# Patient Record
Sex: Male | Born: 1970 | Hispanic: No | Smoking: Never smoker
Health system: Southern US, Community
[De-identification: ages and names within clinical notes are randomized; demographics above are authoritative.]

## PROBLEM LIST (undated history)

## (undated) ENCOUNTER — Emergency Department (HOSPITAL_COMMUNITY): Admission: EM | Payer: PRIVATE HEALTH INSURANCE | Source: Home / Self Care

## (undated) DIAGNOSIS — N492 Inflammatory disorders of scrotum: Secondary | ICD-10-CM

## (undated) DIAGNOSIS — E7800 Pure hypercholesterolemia, unspecified: Secondary | ICD-10-CM

## (undated) DIAGNOSIS — A4902 Methicillin resistant Staphylococcus aureus infection, unspecified site: Secondary | ICD-10-CM

## (undated) HISTORY — DX: Inflammatory disorders of scrotum: N49.2

## (undated) HISTORY — PX: TONSILLECTOMY: SUR1361

## (undated) HISTORY — DX: Pure hypercholesterolemia, unspecified: E78.00

## (undated) HISTORY — DX: Methicillin resistant Staphylococcus aureus infection, unspecified site: A49.02

---

## 2003-04-27 ENCOUNTER — Other Ambulatory Visit: Admission: RE | Admit: 2003-04-27 | Discharge: 2003-04-27 | Payer: Self-pay | Admitting: Unknown Physician Specialty

## 2003-05-11 ENCOUNTER — Other Ambulatory Visit: Admission: RE | Admit: 2003-05-11 | Discharge: 2003-05-11 | Payer: Self-pay | Admitting: Unknown Physician Specialty

## 2013-01-06 ENCOUNTER — Encounter (HOSPITAL_COMMUNITY): Payer: Self-pay | Admitting: Emergency Medicine

## 2013-01-06 ENCOUNTER — Encounter (HOSPITAL_COMMUNITY): Payer: Self-pay

## 2013-01-06 ENCOUNTER — Emergency Department (HOSPITAL_COMMUNITY)
Admission: EM | Admit: 2013-01-06 | Discharge: 2013-01-06 | Disposition: A | Discharge status: Home / Self Care | Payer: PRIVATE HEALTH INSURANCE | Attending: Emergency Medicine | Admitting: Emergency Medicine

## 2013-01-06 DIAGNOSIS — N5089 Other specified disorders of the male genital organs: Secondary | ICD-10-CM

## 2013-01-06 DIAGNOSIS — N508 Other specified disorders of male genital organs: Secondary | ICD-10-CM | POA: Insufficient documentation

## 2013-01-06 DIAGNOSIS — N509 Disorder of male genital organs, unspecified: Secondary | ICD-10-CM | POA: Insufficient documentation

## 2013-01-06 DIAGNOSIS — R110 Nausea: Secondary | ICD-10-CM | POA: Insufficient documentation

## 2013-01-06 DIAGNOSIS — Z79899 Other long term (current) drug therapy: Secondary | ICD-10-CM | POA: Insufficient documentation

## 2013-01-06 DIAGNOSIS — N498 Inflammatory disorders of other specified male genital organs: Secondary | ICD-10-CM | POA: Insufficient documentation

## 2013-01-06 DIAGNOSIS — N492 Inflammatory disorders of scrotum: Secondary | ICD-10-CM

## 2013-01-06 MED ORDER — TRAMADOL HCL 50 MG PO TABS
50.0000 mg | ORAL_TABLET | Freq: Once | ORAL | Status: AC
  Administered 2013-01-06: 50 mg via ORAL
  Filled 2013-01-06: qty 1

## 2013-01-06 MED ORDER — TRAMADOL HCL 50 MG PO TABS: 50.0000 mg | ORAL_TABLET | Freq: Four times a day (QID) | ORAL | Status: DC | PRN

## 2013-01-06 MED ORDER — HYDROMORPHONE HCL PF 1 MG/ML IJ SOLN
1.0000 mg | Freq: Once | INTRAMUSCULAR | Status: AC
  Administered 2013-01-06: 1 mg via INTRAMUSCULAR
  Filled 2013-01-06: qty 1

## 2013-01-06 MED ORDER — OXYCODONE-ACETAMINOPHEN 5-325 MG PO TABS: 1.0000 | ORAL_TABLET | ORAL | Status: DC | PRN

## 2013-01-06 MED ORDER — IBUPROFEN 800 MG PO TABS: 800.0000 mg | ORAL_TABLET | Freq: Three times a day (TID) | ORAL | Status: DC

## 2013-01-06 MED ORDER — LIDOCAINE HCL (PF) 1 % IJ SOLN
5.0000 mL | Freq: Once | INTRAMUSCULAR | Status: AC
  Administered 2013-01-06: 5 mL via INTRADERMAL
  Filled 2013-01-06: qty 5

## 2013-01-06 NOTE — ED Notes (Signed)
Patient reports abscess/hard cyst to scrotum. Reports recently seen and treated with PO antibiotics, but pain and swelling have increased tremendously.

## 2013-01-06 NOTE — ED Notes (Signed)
Pt alert & oriented x4, stable gait. Patient given discharge instructions, paperwork & prescription(s). Patient  instructed to stop at the registration desk to finish any additional paperwork. Patient verbalized understanding. Pt left department w/ no further questions. 

## 2013-01-06 NOTE — ED Provider Notes (Signed)
History     CSN: [id number]  Arrival date & time 01/06/13  0004   First MD Initiated Contact with Patient 01/06/13 0006      Chief Complaint  Patient presents with  . Recurrent Skin Infections    (Consider location/radiation/quality/duration/timing/severity/associated sxs/prior treatment) HPI Jim Hamilton is a 42 y.o. male who presents to the ED with pain and swelling to the scrotum. The pain started 3 days ago. He has been taking Septra DS and sitting in tubs of warm water. The warm water helps for a while but then the pain returns. He has a history of recurrent dermoid cysts but has never had one in this area before. He sees Dr. Nita Sells (Derm.) and that is why he has the Rx for the Septra for when he has an are that comes up. The history was provided by the patient.  History reviewed. No pertinent past medical history.  History reviewed. No pertinent past surgical history.  No family history on file.  History  Substance Use Topics  . Smoking status: Not on file  . Smokeless tobacco: Not on file  . Alcohol Use: No      Review of Systems  Constitutional: Negative for fever and activity change.  Cardiovascular: Negative for leg swelling.  Gastrointestinal: Positive for nausea. Negative for vomiting and abdominal pain.  Genitourinary: Positive for scrotal swelling. Negative for dysuria, urgency, frequency and difficulty urinating.  Skin: Negative for wound.       ? abscess  Neurological: Negative for light-headedness.  Psychiatric/Behavioral: The patient is not nervous/anxious.     Allergies  Tylenol  Home Medications   Current Outpatient Rx  Name  Route  Sig  Dispense  Refill  . sulfamethoxazole-trimethoprim (BACTRIM DS) 800-160 MG per tablet   Oral   Take 1 tablet by mouth 2 (two) times daily.         Marland Kitchen ibuprofen (ADVIL,MOTRIN) 800 MG tablet   Oral   Take 1 tablet (800 mg total) by mouth 3 (three) times daily.   21 tablet   0   . traMADol (ULTRAM) 50 MG  tablet   Oral   Take 1 tablet (50 mg total) by mouth every 6 (six) hours as needed for pain.   15 tablet   0     BP 128/68  Pulse 81  Temp(Src) 97.9 F (36.6 C) (Oral)  Resp 16  Ht 5\' 7"  (1.702 m)  Wt 149 lb (67.586 kg)  BMI 23.33 kg/m2  SpO2 100%  Physical Exam  Nursing note and vitals reviewed. Constitutional: He is oriented to person, place, and time. He appears well-developed and well-nourished. No distress.  HENT:  Head: Normocephalic.  Eyes: EOM are normal.  Neck: Neck supple.  Cardiovascular: Normal rate.   Pulmonary/Chest: Effort normal.  Abdominal: Soft. There is no tenderness.  Genitourinary:     There is tenderness and a firm mass palpated.   Musculoskeletal: Normal range of motion.  Neurological: He is alert and oriented to person, place, and time. No cranial nerve deficit.  Psychiatric: He has a normal mood and affect. His behavior is normal. Judgment and thought content normal.    ED Course: Dr. Eber Hong in with ultrasound to evaluate the mass. On ultrasound there is no fluid noted.  Procedures (including cr  1. Mass, scrotum    Plan: Discussed findings with the patient and need for follow up with Urology.   Will treat pain and patient will continue Septra DS and warm tub baths.  He will call for follow up appointment tomorrow.  MDM  Discussed with the patient clinical findings and plan of care. All questioned fully answered. Patient is stable for discharge home without any immediate complications.   Medication List    TAKE these medications       ibuprofen 800 MG tablet  Commonly known as:  ADVIL,MOTRIN  Take 1 tablet (800 mg total) by mouth 3 (three) times daily.     traMADol 50 MG tablet  Commonly known as:  ULTRAM  Take 1 tablet (50 mg total) by mouth every 6 (six) hours as needed for pain.      ASK your doctor about these medications       sulfamethoxazole-trimethoprim 800-160 MG per tablet  Commonly known as:  BACTRIM DS  Take 1  tablet by mouth 2 (two) times daily.             [address] [place], [place] 01/06/13 [phone]

## 2013-01-06 NOTE — ED Provider Notes (Signed)
Pt with scrotal pain X several days - has discrete tender, non erythematous mass on the midline just at the base of the scrotum.  No sub q emphysema, no pain with exam of the testicles or penis and no abd pain.  No /f/cn/v.  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  Pt informed, pain control, abx (as he states this is how they have been treated in the past) and f/u with dermatology / Urology  Medical screening examination/treatment/procedure(s) were conducted as a shared visit with non-physician practitioner(s) and myself.  I personally evaluated the patient during the encounter    Vida Roller, MD 01/06/13 [phone]

## 2013-01-06 NOTE — ED Provider Notes (Signed)
pt seen with NP Damian Leavell Here with worsening pain, redness and tenderness to scrotum.  Bedside ultrasound reveals small pocket of fluid.  I suspect this has now turned into abscess with overlying cellulitis.  I discussed options, and he is comfortable with bedside drainage here in the ED.  He was unable to f/u with urology today due to local urologist out of town.  There was no crepitance to scrotum, no erythema/tenderness into perineum and it clearly does not extend into upper scrotum and the testicles are freely mobile without tenderness.  I suspect this is cutaneous.  Small incision was made just right of midline on lower scrotum and fluid was extracted.  Pt tolerated well, feels improved and no immediate complications.  He will return this Saturday morning for recheck.  Discussed strict return precautions  INCISION AND DRAINAGE Performed by: Joya Gaskins Consent: Verbal consent obtained. Risks and benefits: risks, benefits and alternatives were discussed Type: abscess  Body area: lower scrotum  Anesthesia: local infiltration  Incision was made with a scalpel.  Local anesthetic: lidocaine 1%   Anesthetic total: 3 ml  Complexity: complex Blunt dissection to break up loculations  Drainage: purulent  Drainage amount: minimal  Packing material: 1/4 in iodoform gauze  Patient tolerance: Patient tolerated the procedure well with no immediate complications.     Joya Gaskins, MD 01/06/13 2325

## 2013-01-06 NOTE — ED Notes (Signed)
Pt has history of recurrent skin abcesses, states tonight with c/o swelling, pain and redness to scrotum.

## 2013-01-06 NOTE — ED Provider Notes (Signed)
Medical screening examination/treatment/procedure(s) were conducted as a shared visit with non-physician practitioner(s) and myself.  I personally evaluated the patient during the encounter   Joya Gaskins, MD 01/06/13 2345

## 2013-01-06 NOTE — Discharge Instructions (Signed)
Scrotal Swelling Scrotal swelling may occur on one or both sides of the scrotum. Pain may also occur with swelling. Early evaluation with your caregiver and treatment is important.  CAUSES   Injury.  Infection.  An ingrown hair or abrasion in the area.  Repeated rubbing from tight-fitting underwear.  Poor hygiene.  A weakened area in the muscles around the groin (hernia). A hernia can allow abdominal contents to push into the scrotum.  Fluid around the testicle (hydrocele).  Enlarged vein around the testicle (varicocele).  Certain medical treatments or existing conditions.  A recent genital surgery or procedure.  The spermatic cord becomes twisted in the scrotum, which cuts off blood supply (testicular torsion).  Testicular cancer. DIAGNOSIS A physical exam and medical history may be performed. You will be asked questions about recent activity and where and when the swelling began. Further tests may be performed to confirm the diagnosis, such as an imaging test (ultrasound or MRI). TREATMENT Treatment will depend on the cause of the swelling. Treatment may include:  Self care (rest, ice, limiting activity, scrotal support).  Pain medicine.  Taking medicines to treat an infection.  Surgery or help from a specialist (urologist). HOME CARE INSTRUCTIONS  Rest and limit activity until the swelling goes away. Lying down is the preferred position.  Put ice on the scrotum.  Put ice in a plastic bag.  Place a towel between your skin and the bag.  Leave the ice on for 15 to 20 minutes, 3 to 4 times a day for 1 to 2 days.  Place a rolled towel under the testicles for support.  Wear loose-fitting clothing or an athletic support cup for comfort.  Take all medicines as directed by your caregiver.  Perform a monthly self-exam of the scrotum and penis. Feel for changes. Ask your caregiver how to perform a monthly self-exam if you are unsure. There are a few things you can  do to help prevent injury or infection that can lead to swelling:  Wear an athletic support and protective cup during sports activities.  Practice safe sex. Wear condoms.  Follow all of your caregiver's recommendations after a surgical procedure. Discuss appropriate time frames for healing and bedrest with your surgeon.  Get vaccinated against mumps, measles, and rubella (MMR).  Use good body mechanics during activity and when lifting heavy objects. Avoid bearing down or straining.  Drink enough fluids to keep your urine clear or pale yellow. Always empty the bladder completely when urinating to avoid infection. Finding out the results of your test Ask when your test results will be ready. Make sure you get your test results. SEEK MEDICAL CARE IF:  You have a sudden (acute) onset of pain that is persistent and not improving.  You notice a heavy feeling or fluid in the scrotum.  You have pain or burning while urinating.  You have blood in the urine or semen.  You feel a lump around the testicle.  You notice that one testicle is larger than the other (slight variation is normal).  You have a persistent dull ache or pain in the groin or scrotum.  You need instruction on performing a monthly self-exam. SEEK IMMEDIATE MEDICAL CARE IF:  The pain does not go away or becomes severe.  You have a fever.  You have pain or vomiting that cannot be controlled.  You notice significant redness or swelling of one or both sides of the scrotum. MAKE SURE YOU:  Understand these instructions.  Will watch  your condition.  Will get help right away if you are not doing well or get worse. Document Released: 09/13/2010 Document Revised: 11/03/2011 Document Reviewed: 09/13/2010 [facility] Patient Information 2013 [place], [place].

## 2013-01-06 NOTE — ED Provider Notes (Signed)
History     CSN: [id number]  Arrival date & time 01/06/13  2112   First MD Initiated Contact with Patient 01/06/13 2141      Chief Complaint  Patient presents with  . Abscess    (Consider location/radiation/quality/duration/timing/severity/associated sxs/prior treatment) HPI Jim Hamilton is a 42 y.o. male who presents to the ED with pain in the scrotal area. He was here last night and evaluated for same. He called Dr. Jerre Simon  the Urologist today for follow up and was told he would be out of the office for several days and was given the number for [facility] in [place]. He called and they could not see him before June 6th. The pain tonight is severe and the swelling has increased and now there is redness. Patient is taking his antibiotics. The history was provided by the patient and his medical record.   History reviewed. No pertinent past medical history.  History reviewed. No pertinent past surgical history.  History reviewed. No pertinent family history.  History  Substance Use Topics  . Smoking status: Never Smoker   . Smokeless tobacco: Not on file  . Alcohol Use: No      Review of Systems  Constitutional: Negative for fever and chills.  Genitourinary: Positive for scrotal swelling and testicular pain.  Musculoskeletal: Negative for back pain.  Neurological: Negative for headaches.  Psychiatric/Behavioral: The patient is not nervous/anxious.     Allergies  Tylenol  Home Medications   Current Outpatient Rx  Name  Route  Sig  Dispense  Refill  . ibuprofen (ADVIL,MOTRIN) 800 MG tablet   Oral   Take 1 tablet (800 mg total) by mouth 3 (three) times daily.   21 tablet   0   . omeprazole (PRILOSEC) 20 MG capsule   Oral   Take 20 mg by mouth daily.         Marland Kitchen sulfamethoxazole-trimethoprim (BACTRIM DS) 800-160 MG per tablet   Oral   Take 1 tablet by mouth 2 (two) times daily. Started on 01/05/2013         . traMADol (ULTRAM) 50 MG tablet   Oral  Take 1 tablet (50 mg total) by mouth every 6 (six) hours as needed for pain.   15 tablet   0     BP 124/68  Pulse 93  Temp(Src) 98.6 F (37 C) (Oral)  Resp 18  Ht 5\' 7"  (1.702 m)  Wt 150 lb (68.04 kg)  BMI 23.49 kg/m2  SpO2 100%  Physical Exam  Nursing note and vitals reviewed. Constitutional: He appears well-developed and well-nourished. No distress.  HENT:  Head: Normocephalic.  Eyes: EOM are normal.  Neck: Neck supple.  Cardiovascular: Normal rate.   Pulmonary/Chest: Effort normal.  Genitourinary:     Tenderness and swelling has increased since visit last night. There is also erythema noted tonight.  Musculoskeletal: Normal range of motion.  Psychiatric: He has a normal mood and affect.    ED Course  Procedures (including critical care time) Dr. Bebe Shaggy in to examine the patient. He will assume care and I&D the abscess.  MDM          Janne Napoleon, NP 01/06/13 2306

## 2013-01-31 ENCOUNTER — Ambulatory Visit (INDEPENDENT_AMBULATORY_CARE_PROVIDER_SITE_OTHER): Payer: PRIVATE HEALTH INSURANCE | Admitting: Infectious Disease

## 2013-01-31 ENCOUNTER — Other Ambulatory Visit: Payer: Self-pay | Admitting: Licensed Clinical Social Worker

## 2013-01-31 ENCOUNTER — Encounter: Payer: Self-pay | Admitting: Infectious Disease

## 2013-01-31 VITALS — BP 121/78 | HR 80 | Temp 97.9°F | Ht 67.0 in | Wt 156.0 lb

## 2013-01-31 DIAGNOSIS — Z113 Encounter for screening for infections with a predominantly sexual mode of transmission: Secondary | ICD-10-CM

## 2013-01-31 DIAGNOSIS — A4902 Methicillin resistant Staphylococcus aureus infection, unspecified site: Secondary | ICD-10-CM

## 2013-01-31 DIAGNOSIS — N492 Inflammatory disorders of scrotum: Secondary | ICD-10-CM | POA: Insufficient documentation

## 2013-01-31 DIAGNOSIS — M6520 Calcific tendinitis, unspecified site: Secondary | ICD-10-CM

## 2013-01-31 DIAGNOSIS — N498 Inflammatory disorders of other specified male genital organs: Secondary | ICD-10-CM

## 2013-01-31 DIAGNOSIS — M71449 Calcium deposit in bursa, unspecified hand: Secondary | ICD-10-CM

## 2013-01-31 LAB — BASIC METABOLIC PANEL WITH GFR
BUN: 15 mg/dL (ref 6–23)
Calcium: 10 mg/dL (ref 8.4–10.5)
GFR, Est African American: >89 mL/min
Glucose, Bld: 91 mg/dL (ref 70–99)
Potassium: 4.9 mEq/L (ref 3.5–5.3)

## 2013-01-31 MED ORDER — DOXYCYCLINE HYCLATE 100 MG PO TABS: 100.0000 mg | ORAL_TABLET | Freq: Two times a day (BID) | ORAL | Status: DC

## 2013-01-31 NOTE — Progress Notes (Signed)
Subjective:    Patient ID: Jim Hamilton, male    DOB: [birth year], 42 y.o.   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 He states for >10 years or more he has had recurrences bumps on his head, leg, arms that rise up and then harden and then resolve. He claims that he has been treated by his PCP and by a Dermatologist who stated that he has abnormal deposition of calcium in his skin and soft tissue as part of a heriditary condition ? calcium pyrophosphate crystal deposition disease?  In any case he was recently seen in ED for scrotal abscess that required I and D and from which grew MRSA R to FQ, S to TMP/SMX, Tetracyclines. He has finished therapy for this and was seen by [facility]. He currently has a large lesion on his scalp that he says has been there for 4 months and a newer lesion on right thigh. He was referred to [place] for evaluation and treatment. His 58 year old daughter is apparently suffering from eczema and may also be colonized with MRSA. Wife has had no symptomatic disease.   Review of Systems  Constitutional: Negative for fever, chills, diaphoresis, activity change, appetite change, fatigue and unexpected weight change.  HENT: Negative for congestion, sore throat, rhinorrhea, sneezing, trouble swallowing and sinus pressure.   Eyes: Negative for photophobia and visual disturbance.  Respiratory: Negative for cough, chest tightness, shortness of breath, wheezing and stridor.   Cardiovascular: Negative for chest pain, palpitations and leg swelling.  Gastrointestinal: Negative for nausea, vomiting, abdominal pain, diarrhea, constipation, blood in stool, abdominal distention and anal bleeding.  Genitourinary: Negative for dysuria, hematuria, flank pain and difficulty urinating.  Musculoskeletal: Negative for myalgias, back pain, joint swelling, arthralgias and gait problem.  Skin:  Positive for color change. Negative for pallor, rash and wound.  Neurological: Negative for dizziness, tremors, weakness and light-headedness.  Hematological: Negative for adenopathy. Does not bruise/bleed easily.  Psychiatric/Behavioral: Negative for behavioral problems, confusion, sleep disturbance, dysphoric mood, decreased concentration and agitation.       Objective:   Physical Exam  Constitutional: He is oriented to person, place, and time. He appears well-developed and well-nourished. No distress.  HENT:  Head: Normocephalic and atraumatic.    Eyes: Conjunctivae and EOM are normal.  Neck: Normal range of motion. Neck supple. No JVD present.  Cardiovascular: Normal rate, regular rhythm and normal heart sounds.   Pulmonary/Chest: Effort normal. No respiratory distress.  Abdominal: He exhibits mass. He exhibits no distension. There is no tenderness. There is no rebound.  Musculoskeletal: He exhibits no edema and no tenderness.       Arms:      Legs: Neurological: He is alert and oriented to person, place, and time. He exhibits normal muscle tone. Coordination normal.  Skin: Skin is warm and dry. He is not diaphoretic. There is erythema. No pallor.  Psychiatric: He has a normal mood and affect. His behavior is normal. Judgment and thought content normal.          Assessment & Plan:  #1 Recurrent MRSA infection: not clear how much of this is true MRSA driven vs condition his dermatologist has diagnosed him with. I will test him for HIV, I will give him 30 day rx with doxycycline and then attempt a decolonization regimen with him , his wife and daughter in synchrony. I have advised him to refrain from using  his shaving razor that he uses on his scalp where he has frequent eruptions   #2 calcium pyrophosphate crystal deposition disease?: would like to get records from his Dermatologist  #3 Scrotal abscess: resolved  #4 STD screen: check HIV

## 2013-02-24 ENCOUNTER — Ambulatory Visit (INDEPENDENT_AMBULATORY_CARE_PROVIDER_SITE_OTHER): Payer: PRIVATE HEALTH INSURANCE | Admitting: Infectious Disease

## 2013-02-24 ENCOUNTER — Encounter: Payer: Self-pay | Admitting: Infectious Disease

## 2013-02-24 VITALS — BP 138/73 | HR 81 | Temp 98.0°F | Ht 67.0 in | Wt 157.0 lb

## 2013-02-24 DIAGNOSIS — A4902 Methicillin resistant Staphylococcus aureus infection, unspecified site: Secondary | ICD-10-CM

## 2013-02-24 DIAGNOSIS — M71441 Calcium deposit in bursa, right hand: Secondary | ICD-10-CM

## 2013-02-24 DIAGNOSIS — Z113 Encounter for screening for infections with a predominantly sexual mode of transmission: Secondary | ICD-10-CM

## 2013-02-24 DIAGNOSIS — M6520 Calcific tendinitis, unspecified site: Secondary | ICD-10-CM

## 2013-02-24 MED ORDER — CHLORHEXIDINE GLUCONATE 4 % EX LIQD: 60.0000 mL | CUTANEOUS | Status: DC

## 2013-02-24 MED ORDER — MUPIROCIN 2 % EX OINT: TOPICAL_OINTMENT | Freq: Two times a day (BID) | CUTANEOUS | Status: DC

## 2013-02-24 NOTE — Progress Notes (Signed)
Subjective:    Patient ID: Jim Hamilton, male    DOB: 1971/01/02, 42 y.o.   MRN: [id number]  HPI   42 year old Caucasian male with  History of >10 years or more he has had recurrences bumps on his head, leg, arms that rise up and then harden and then resolve. He claims that he has been treated by his PCP and by a Dermatologist who stated that he has abnormal deposition of calcium in his skin and soft tissue as part of a heriditary condition.  calcium pyrophosphate crystal deposition disease?  In any case he was recently seen in ED for scrotal abscess that required I and D and from which grew MRSA R to FQ, S to TMP/SMX, Tetracyclines. He has finished therapy for this and was seen by [facility].  I saw him a month ago and gave him a course of doxycyline but there has been little change in a large lesion on his scalp that he says has been there for 4 months     Review of Systems  Constitutional: Negative for fever, chills, diaphoresis, activity change, appetite change, fatigue and unexpected weight change.  HENT: Negative for congestion, sore throat, rhinorrhea, sneezing, trouble swallowing and sinus pressure.   Eyes: Negative for photophobia and visual disturbance.  Respiratory: Negative for cough, chest tightness, shortness of breath, wheezing and stridor.   Cardiovascular: Negative for chest pain, palpitations and leg swelling.  Gastrointestinal: Negative for nausea, vomiting, abdominal pain, diarrhea, constipation, blood in stool, abdominal distention and anal bleeding.  Genitourinary: Negative for dysuria, hematuria, flank pain and difficulty urinating.  Musculoskeletal: Negative for myalgias, back pain, joint swelling, arthralgias and gait problem.  Skin: Positive for rash. Negative for color change, pallor and wound.  Neurological: Negative for dizziness, tremors, weakness and light-headedness.  Hematological: Negative for adenopathy. Does not bruise/bleed easily.   Psychiatric/Behavioral: Negative for behavioral problems, confusion, sleep disturbance, dysphoric mood, decreased concentration and agitation.       Objective:   Physical Exam  Constitutional: He is oriented to person, place, and time. He appears well-developed and well-nourished. No distress.  HENT:  Head: Normocephalic and atraumatic.  Mouth/Throat: Oropharynx is clear and moist. No oropharyngeal exudate.  Eyes: Conjunctivae and EOM are normal. Pupils are equal, round, and reactive to light. No scleral icterus.  Neck: Normal range of motion. Neck supple. No JVD present.  Cardiovascular: Normal rate, regular rhythm and normal heart sounds.  Exam reveals no gallop and no friction rub.   No murmur heard. Pulmonary/Chest: Effort normal and breath sounds normal. No respiratory distress. He has no wheezes. He has no rales. He exhibits no tenderness.  Abdominal: He exhibits no distension and no mass. There is no tenderness. There is no rebound and no guarding.  Musculoskeletal: He exhibits no edema and no tenderness.  Lymphadenopathy:    He has no cervical adenopathy.  Neurological: He is alert and oriented to person, place, and time. He exhibits normal muscle tone. Coordination normal.  Skin: Skin is warm and dry. He is not diaphoretic. No erythema. No pallor.  Psychiatric: He has a normal mood and affect. His behavior is normal. Judgment and thought content normal.          Assessment & Plan:   #1 Recurrent MRSA infection: not clear how much of this is true MRSA driven vs condition his dermatologist has diagnosed him with. I will also give a decolonization regimen with him , his wife and daughter in synchrony. I have advised him  to refrain from using his shaving razor that he uses on his scalp where he has frequent eruptions   #2 calcium pyrophosphate crystal deposition disease?: would like to get records from his Dermatologist Dr Jim Hamilton  #3 Scrotal abscess: resolved  #4 STD  screen: HIV negative.

## 2013-03-10 ENCOUNTER — Ambulatory Visit: Payer: PRIVATE HEALTH INSURANCE | Admitting: Infectious Disease

## 2013-05-30 ENCOUNTER — Ambulatory Visit: Payer: PRIVATE HEALTH INSURANCE | Admitting: Infectious Disease

## 2013-06-30 ENCOUNTER — Other Ambulatory Visit: Payer: Self-pay

## 2013-11-04 ENCOUNTER — Other Ambulatory Visit: Payer: Self-pay | Admitting: Infectious Disease

## 2015-07-27 ENCOUNTER — Encounter: Payer: Self-pay | Admitting: Family Medicine

## 2015-07-27 ENCOUNTER — Ambulatory Visit (INDEPENDENT_AMBULATORY_CARE_PROVIDER_SITE_OTHER): Payer: PRIVATE HEALTH INSURANCE | Admitting: Family Medicine

## 2015-07-27 VITALS — BP 130/70 | Temp 98.3°F | Ht 67.0 in | Wt 156.1 lb

## 2015-07-27 DIAGNOSIS — B9689 Other specified bacterial agents as the cause of diseases classified elsewhere: Secondary | ICD-10-CM

## 2015-07-27 DIAGNOSIS — J019 Acute sinusitis, unspecified: Secondary | ICD-10-CM

## 2015-07-27 MED ORDER — PANTOPRAZOLE SODIUM 40 MG PO TBEC: 40.0000 mg | DELAYED_RELEASE_TABLET | Freq: Every day | ORAL | Status: DC

## 2015-07-27 MED ORDER — AMOXICILLIN-POT CLAVULANATE 875-125 MG PO TABS: 1.0000 | ORAL_TABLET | Freq: Two times a day (BID) | ORAL | Status: DC

## 2015-07-27 NOTE — Progress Notes (Signed)
   Subjective:    Patient ID: Jim Hamilton, male    DOB: October 16, 1970, 44 y.o.   MRN: [id number]  Sinusitis This is a new problem. The current episode started in the past 7 days. The problem is unchanged. There has been no fever. Associated symptoms include congestion, coughing, ear pain, headaches and sinus pressure. (Dizziness and vomiting) Treatments tried: tylenol  cold and sinus, flonase. The treatment provided no relief.   Patient relates a lot of sinus pressure pain discomfort intermittent reflux symptoms of gastritis/epigastric burning denies dysphagia.   Review of Systems  Constitutional: Negative for fever and activity change.  HENT: Positive for congestion, ear pain, rhinorrhea and sinus pressure.   Eyes: Negative for discharge.  Respiratory: Positive for cough. Negative for wheezing.   Cardiovascular: Negative for chest pain.  Neurological: Positive for headaches.       Objective:   Physical Exam  Constitutional: He appears well-developed.  HENT:  Head: Normocephalic.  Mouth/Throat: Oropharynx is clear and moist. No oropharyngeal exudate.  Neck: Normal range of motion.  Cardiovascular: Normal rate, regular rhythm and normal heart sounds.   No murmur heard. Pulmonary/Chest: Effort normal and breath sounds normal. He has no wheezes.  Lymphadenopathy:    He has no cervical adenopathy.  Neurological: He exhibits normal muscle tone.  Skin: Skin is warm and dry.  Nursing note and vitals reviewed.         Assessment & Plan:  Significant sinus issues with sinus pressure pain discomfort will treat with antibiotics patient follow-up if progressive troubles.  Intermittent reflux issues was using omeprazole I recommend protonix if this does not dramatically improve the issue over the next couple weeks he is to follow-up

## 2015-09-06 ENCOUNTER — Ambulatory Visit: Payer: PRIVATE HEALTH INSURANCE | Admitting: Family Medicine

## 2015-09-24 ENCOUNTER — Emergency Department (HOSPITAL_COMMUNITY)
Admission: EM | Admit: 2015-09-24 | Discharge: 2015-09-24 | Disposition: A | Discharge status: Home / Self Care | Payer: PRIVATE HEALTH INSURANCE | Attending: Emergency Medicine | Admitting: Emergency Medicine

## 2015-09-24 ENCOUNTER — Emergency Department (HOSPITAL_COMMUNITY): Payer: PRIVATE HEALTH INSURANCE

## 2015-09-24 ENCOUNTER — Telehealth: Payer: Self-pay

## 2015-09-24 ENCOUNTER — Encounter (HOSPITAL_COMMUNITY): Payer: Self-pay | Admitting: Emergency Medicine

## 2015-09-24 DIAGNOSIS — Z792 Long term (current) use of antibiotics: Secondary | ICD-10-CM | POA: Diagnosis not present

## 2015-09-24 DIAGNOSIS — R079 Chest pain, unspecified: Secondary | ICD-10-CM | POA: Diagnosis not present

## 2015-09-24 DIAGNOSIS — Z8614 Personal history of Methicillin resistant Staphylococcus aureus infection: Secondary | ICD-10-CM | POA: Diagnosis not present

## 2015-09-24 DIAGNOSIS — Z87438 Personal history of other diseases of male genital organs: Secondary | ICD-10-CM | POA: Insufficient documentation

## 2015-09-24 DIAGNOSIS — R0602 Shortness of breath: Secondary | ICD-10-CM | POA: Diagnosis not present

## 2015-09-24 DIAGNOSIS — Z79899 Other long term (current) drug therapy: Secondary | ICD-10-CM | POA: Insufficient documentation

## 2015-09-24 NOTE — Telephone Encounter (Signed)
Patient's wife called and stated that her husband is having chest pains that has been going on for a few days now. Patient complains of chest pain that goes and comes, is worse at times than others, and chest pain with movement at times also. Wife states that patient is not sure if this is just muscle pain or if it is his heart. Patient's father died in his 61's from a massive heart attack. Per Dr. Sallee Lange- Patient needs to go to the nearest ER or Urgent care for immediate treatment. Next available appointment is 4:30 and that is too long to wait. Wife verbalized understanding and stated the she will tell her husband.

## 2015-09-24 NOTE — Discharge Instructions (Signed)

## 2015-09-24 NOTE — ED Provider Notes (Signed)
CSN: [id number]     Arrival date & time 09/24/15  1151 History  By signing my name below, I, Jim Hamilton, attest that this documentation has been prepared under the direction and in the presence of Jim Fraise, MD. Electronically Signed: [name], ED Scribe. 09/24/2015. 12:52 PM.   Chief Complaint  Patient presents with  . Chest Pain   Patient is a 45 y.o. male presenting with chest pain. The history is provided by the patient. No language interpreter was used.  Chest Pain Pain location:  Substernal area Pain quality: aching and tearing   Pain radiates to:  Does not radiate Pain radiates to the back: no   Pain severity:  Mild Onset quality:  Sudden Duration:  2 days Timing:  Constant Progression:  Worsening Chronicity:  Recurrent Context: movement   Worsened by:  Exertion Ineffective treatments:  None tried Associated symptoms: shortness of breath   Associated symptoms: no cough, no diaphoresis, no dizziness, no fever, no nausea, no numbness, no syncope, not vomiting and no weakness    HPI Comments: Jim Hamilton is a 45 y.o. male who presents to the Emergency Department complaining of aching, tearing, constant, gradually worsening chest pain onset one week ago, worsening yesterday. Pt reports worsening pain with twisting movements of the upper body He states that his PCP advised him to come to the ED. He reports a family hx of heart disease. He states that he has had similar symptoms 3-4 times in the past, but does not know the cough. Pt denies fever, chills, diaphoresis, cough, leg swelling, nausea, vomiting, numbness, weakness, dizziness, syncope, heavy lifting, allergies to medications, recent long travel, significant medical hx, hx of heart disease, or hx of blood clots. Pt takes Protonix daily.   Past Medical History  Diagnosis Date  . Scrotal abscess   . MRSA infection    Past Surgical History  Procedure Laterality Date  . Tonsillectomy     Family History   Problem Relation Age of Onset  . Multiple sclerosis Mother   . Eczema Daughter    Social History  Substance Use Topics  . Smoking status: Never Smoker   . Smokeless tobacco: Never Used  . Alcohol Use: No    Review of Systems  Constitutional: Negative for fever, chills and diaphoresis.  Respiratory: Positive for shortness of breath. Negative for cough.   Cardiovascular: Positive for chest pain. Negative for syncope.  Gastrointestinal: Negative for nausea and vomiting.  Neurological: Negative for dizziness, syncope, weakness and numbness.  All other systems reviewed and are negative.  Allergies  Tylenol  Home Medications   Prior to Admission medications   Medication Sig Start Date End Date Taking? Authorizing Provider  amoxicillin-clavulanate (AUGMENTIN) 875-125 MG tablet Take 1 tablet by mouth 2 (two) times daily. 07/27/15   [name], MD  pantoprazole (PROTONIX) 40 MG tablet Take 1 tablet (40 mg total) by mouth daily. 07/27/15   [name], MD   BP 124/76 mmHg  Pulse 77  Temp(Src) 98.1 F (36.7 C) (Oral)  Resp 18  Ht 5\' 6"  (1.676 m)  Wt 156 lb (70.761 kg)  BMI 25.19 kg/m2  SpO2 100% Physical Exam CONSTITUTIONAL: Well developed/well nourished HEAD: Normocephalic/atraumatic EYES: EOMI/PERRL ENMT: Mucous membranes moist NECK: supple no meningeal signs SPINE/BACK:entire spine nontender CV: S1/S2 noted, no murmurs/rubs/gallops noted CHEST: significant tenderness noted with movement of upper body, no bruising or crepitus LUNGS: Lungs are clear to auscultation bilaterally, no apparent distress ABDOMEN: soft, nontender, no rebound or guarding,  bowel sounds noted throughout abdomen GU:no cva tenderness NEURO: Pt is awake/alert/appropriate, moves all extremitiesx4.  No facial droop.   EXTREMITIES: pulses normal/equal, full ROM; no calf tenderness or edema noted SKIN: warm, color normal PSYCH: no abnormalities of mood noted, alert and oriented to situation  ED  Course  Procedures  DIAGNOSTIC STUDIES: Oxygen Saturation is 100% on RA, normal by my interpretation.    COORDINATION OF CARE: 12:49 PM-Discussed treatment plan which includes chest x-ray with pt at bedside and pt agreed to plan.   Pt reports his chest feels "like it is tearing" when he moves his upper body His CP is only present with movement of upper body He reports mild SOB earlier when he was looking back while backing up his car No other associated symptoms He appears PERC negative He has no signs of acute aortic dissection My suspicion for ACS/myocarditis is low Due to reproducibility of pain I doubt pericarditis  but I did recommend oral NSAIDs We discussed strict ER return precautions  Imaging Review Dg Chest 2 View  09/24/2015  CLINICAL DATA:  Gradually worsening chest pain onset 1 week ago, worsening yesterday. EXAM: CHEST  2 VIEW COMPARISON:  None. FINDINGS: Heart size is normal. Overall cardiomediastinal silhouette is normal in size and configuration. Lungs are clear. Lung volumes are normal. No pleural effusion. No pneumothorax. Slight elevation of the right hemidiaphragm, of uncertain chronicity. Osseous and soft tissue structures about the chest are otherwise unremarkable. IMPRESSION: Lungs are clear and there is no evidence of acute cardiopulmonary abnormality. Electronically Signed   By: [name] M.D.   On: 09/24/2015 13:08   I have personally reviewed and evaluated these images  results as part of my medical decision-making.   EKG Interpretation   Date/Time:  Monday September 24 2015 13:13:09 EST Ventricular Rate:  68 PR Interval:  148 QRS Duration: 96 QT Interval:  370 QTC Calculation: 393 R Axis:   4 Text Interpretation:  Sinus rhythm ST elevation suggests acute  pericarditis Confirmed by Christy Gentles  MD, Elenore Rota ([id number]) on 09/24/2015  1:34:09 PM      MDM   Final diagnoses:  Chest pain, unspecified chest pain type    Nursing notes including past  medical history and social history reviewed and considered in documentation xrays/imaging reviewed by myself and considered during evaluation   I personally performed the services described in this documentation, which was scribed in my presence. The recorded information has been reviewed and is accurate.       Jim Fraise, MD 09/24/15 [phone]

## 2015-09-24 NOTE — ED Notes (Signed)
Patient with no complaints at this time. Respirations even and unlabored. Skin warm/dry. Discharge instructions reviewed with patient at this time. Patient given opportunity to voice concerns/ask questions. Patient discharged at this time and left Emergency Department with steady gait.   

## 2015-09-24 NOTE — ED Notes (Signed)
Pt reports cp x1 week radiating to right arm.  Pt reports that when he turns a certain way it feels like something is pulling in his mid chest.  Pt denies sob or any other symptoms.

## 2015-11-22 ENCOUNTER — Encounter: Payer: Self-pay | Admitting: Family Medicine

## 2015-11-22 ENCOUNTER — Ambulatory Visit (INDEPENDENT_AMBULATORY_CARE_PROVIDER_SITE_OTHER): Payer: PRIVATE HEALTH INSURANCE | Admitting: Family Medicine

## 2015-11-22 VITALS — BP 110/78 | Temp 98.5°F | Ht 67.0 in | Wt 164.0 lb

## 2015-11-22 DIAGNOSIS — J019 Acute sinusitis, unspecified: Secondary | ICD-10-CM

## 2015-11-22 DIAGNOSIS — B9689 Other specified bacterial agents as the cause of diseases classified elsewhere: Secondary | ICD-10-CM

## 2015-11-22 MED ORDER — CEFPROZIL 500 MG PO TABS: 500.0000 mg | ORAL_TABLET | Freq: Two times a day (BID) | ORAL | Status: DC

## 2015-11-22 NOTE — Progress Notes (Signed)
   Subjective:    Patient ID: Jim Hamilton, male    DOB: [birth year], 45 y.o.   MRN: [id number]  Sinus Problem This is a new problem. The current episode started in the past 7 days. Associated symptoms include congestion, coughing, ear pain, headaches and a sore throat. (Vomiting) Treatments tried: otc meds.    Stared four d ago Cough sig at times  Headache diffuse in nature  burnign cong in the chest    Left ear pain considerable      Review of Systems  HENT: Positive for congestion, ear pain and sore throat.   Respiratory: Positive for cough.   Neurological: Positive for headaches.       Objective:   Physical Exam Alert, mild malaise. Hydration good Vitals stable. frontal/ maxillary tenderness evident positive nasal congestion. pharynx normal neck supple  lungs clear/no crackles or wheezes. heart regular in rhythm        Assessment & Plan:  Impression rhinosinusitis likely post viral, discussed with patient. plan antibiotics prescribed. Questions answered. Symptomatic care discussed. warning signs discussed. WSL

## 2016-06-19 ENCOUNTER — Telehealth: Payer: Self-pay | Admitting: Family Medicine

## 2016-06-19 DIAGNOSIS — R5383 Other fatigue: Secondary | ICD-10-CM

## 2016-06-19 DIAGNOSIS — Z13220 Encounter for screening for lipoid disorders: Secondary | ICD-10-CM

## 2016-06-19 NOTE — Telephone Encounter (Signed)
Lipid, liver, metabolic 7, CBC 

## 2016-06-19 NOTE — Telephone Encounter (Signed)
Pt is requesting lab orders to be sent over for an upcoming physical. There are no previous labs in epic.

## 2016-06-19 NOTE — Telephone Encounter (Signed)
Blood work ordered in EPIC. Patient notified. 

## 2016-06-28 LAB — CBC WITH DIFFERENTIAL/PLATELET
BASOS ABS: 0 10*3/uL (ref 0.0–0.2)
Basos: 1 %
EOS (ABSOLUTE): 0.1 10*3/uL (ref 0.0–0.4)
EOS: 2 %
HEMATOCRIT: 46.7 % (ref 37.5–51.0)
HEMOGLOBIN: 16 g/dL (ref 12.6–17.7)
Immature Grans (Abs): 0 10*3/uL (ref 0.0–0.1)
Immature Granulocytes: 0 %
LYMPHS ABS: 1.6 10*3/uL (ref 0.7–3.1)
Lymphs: 33 %
MCH: 32.1 pg (ref 26.6–33.0)
MCHC: 34.3 g/dL (ref 31.5–35.7)
MCV: 94 fL (ref 79–97)
MONOCYTES: 9 %
Monocytes Absolute: 0.5 10*3/uL (ref 0.1–0.9)
NEUTROS ABS: 2.7 10*3/uL (ref 1.4–7.0)
Neutrophils: 55 %
Platelets: 236 10*3/uL (ref 150–379)
RBC: 4.98 x10E6/uL (ref 4.14–5.80)
RDW: 12.6 % (ref 12.3–15.4)
WBC: 4.9 10*3/uL (ref 3.4–10.8)

## 2016-06-28 LAB — HEPATIC FUNCTION PANEL
ALK PHOS: 102 IU/L (ref 39–117)
ALT: 125 IU/L — AB (ref 0–44)
AST: 79 IU/L — ABNORMAL HIGH (ref 0–40)
Albumin: 4.7 g/dL (ref 3.5–5.5)
BILIRUBIN TOTAL: 0.5 mg/dL (ref 0.0–1.2)
BILIRUBIN, DIRECT: 0.12 mg/dL (ref 0.00–0.40)
Total Protein: 7.1 g/dL (ref 6.0–8.5)

## 2016-06-28 LAB — BASIC METABOLIC PANEL
BUN / CREAT RATIO: 17 (ref 9–20)
BUN: 17 mg/dL (ref 6–24)
CHLORIDE: 101 mmol/L (ref 96–106)
CO2: 25 mmol/L (ref 18–29)
Calcium: 10.1 mg/dL (ref 8.7–10.2)
Creatinine, Ser: 1 mg/dL (ref 0.76–1.27)
GFR calc non Af Amer: 91 mL/min/{1.73_m2} (ref >59)
GFR, EST AFRICAN AMERICAN: 105 mL/min/{1.73_m2} (ref >59)
Glucose: 92 mg/dL (ref 65–99)
Potassium: 5.4 mmol/L — ABNORMAL HIGH (ref 3.5–5.2)
Sodium: 141 mmol/L (ref 134–144)

## 2016-06-28 LAB — LIPID PANEL
CHOL/HDL RATIO: 6.3 ratio — AB (ref 0.0–5.0)
CHOLESTEROL TOTAL: 247 mg/dL — AB (ref 100–199)
HDL: 39 mg/dL — ABNORMAL LOW (ref >39)
LDL Calculated: 178 mg/dL — ABNORMAL HIGH (ref 0–99)
TRIGLYCERIDES: 151 mg/dL — AB (ref 0–149)
VLDL Cholesterol Cal: 30 mg/dL (ref 5–40)

## 2016-06-30 ENCOUNTER — Ambulatory Visit (INDEPENDENT_AMBULATORY_CARE_PROVIDER_SITE_OTHER): Payer: PRIVATE HEALTH INSURANCE | Admitting: Family Medicine

## 2016-06-30 ENCOUNTER — Encounter: Payer: Self-pay | Admitting: Family Medicine

## 2016-06-30 VITALS — BP 118/72 | Ht 67.0 in | Wt 160.0 lb

## 2016-06-30 DIAGNOSIS — E784 Other hyperlipidemia: Secondary | ICD-10-CM

## 2016-06-30 DIAGNOSIS — Z0000 Encounter for general adult medical examination without abnormal findings: Secondary | ICD-10-CM | POA: Diagnosis not present

## 2016-06-30 DIAGNOSIS — R7401 Elevation of levels of liver transaminase levels: Secondary | ICD-10-CM

## 2016-06-30 DIAGNOSIS — R740 Nonspecific elevation of levels of transaminase and lactic acid dehydrogenase [LDH]: Secondary | ICD-10-CM | POA: Diagnosis not present

## 2016-06-30 DIAGNOSIS — E7849 Other hyperlipidemia: Secondary | ICD-10-CM

## 2016-06-30 NOTE — Progress Notes (Signed)
Subjective:    Patient ID: Jim Hamilton, male    DOB: 1971-04-25, 45 y.o.   MRN: [id number]  HPI The patient comes in today for a wellness visit.    A review of their health history was completed.  A review of medications was also completed.  Any needed refills; no  Eating habits: trying too  Falls/  MVA accidents in past few months: none  Regular exercise: no  Specialist pt sees on regular basis: no  Preventative health issues were discussed.   Additional concerns: none Patient had done some lab work before he came in He does have family history premature heart disease with his dad who smoked and does not watch how he ate. He had unfortunately fatal heart attack in his early 34s. Patient does not smoke Patient only has alcohol once or twice a month not a large amount Patient has no history of cholesterol issues or liver issues to his knowledge Denies fever chills sweats weight loss  Review of Systems  Constitutional: Negative for activity change, appetite change and fever.  HENT: Negative for congestion and rhinorrhea.   Eyes: Negative for discharge.  Respiratory: Negative for cough and wheezing.   Cardiovascular: Negative for chest pain.  Gastrointestinal: Negative for abdominal pain, blood in stool and vomiting.  Genitourinary: Negative for difficulty urinating and frequency.  Musculoskeletal: Negative for neck pain.  Skin: Negative for rash.  Allergic/Immunologic: Negative for environmental allergies and food allergies.  Neurological: Negative for weakness and headaches.  Psychiatric/Behavioral: Negative for agitation.       Objective:   Physical Exam  Constitutional: He appears well-developed and well-nourished.  HENT:  Head: Normocephalic and atraumatic.  Right Ear: External ear normal.  Left Ear: External ear normal.  Nose: Nose normal.  Mouth/Throat: Oropharynx is clear and moist.  Eyes: EOM are normal. Pupils are equal, round, and reactive to light.    Neck: Normal range of motion. Neck supple. No thyromegaly present.  Cardiovascular: Normal rate, regular rhythm and normal heart sounds.   No murmur heard. Pulmonary/Chest: Effort normal and breath sounds normal. No respiratory distress. He has no wheezes.  Abdominal: Soft. Bowel sounds are normal. He exhibits no distension and no mass. There is no tenderness.  Genitourinary: Penis normal.  Musculoskeletal: Normal range of motion. He exhibits no edema.  Lymphadenopathy:    He has no cervical adenopathy.  Neurological: He is alert. He exhibits normal muscle tone.  Skin: Skin is warm and dry. No erythema.  Psychiatric: He has a normal mood and affect. His behavior is normal. Judgment normal.          Assessment & Plan:  Adult wellness-complete.wellness physical was conducted today. Importance of diet and exercise were discussed in detail. In addition to this a discussion regarding safety was also covered. We also reviewed over immunizations and gave recommendations regarding current immunization needed for age. In addition to this additional areas were also touched on including: Preventative health exams needed: Colonoscopy colonoscopy not due  Patient was advised yearly wellness exam  hyperlipidemia-patient more than likely will need to be on a statin because of his significant elevation of LDL but we first need to look at his liver enzymes. I advised patient on a healthy diet regular physical activity for now  Elevated liver enzymes we discussed in detail how this could be hepatitis fatty liver or other problems set up the patient for liver ultrasound as well as lab work.  Both of these issues were discussed in addition  to the standard wellness visit.

## 2016-06-30 NOTE — Patient Instructions (Signed)
DASH Eating Plan  DASH stands for "Dietary Approaches to Stop Hypertension." The DASH eating plan is a healthy eating plan that has been shown to reduce high blood pressure (hypertension). Additional health benefits may include reducing the risk of type 2 diabetes mellitus, heart disease, and stroke. The DASH eating plan may also help with weight loss.  WHAT DO I NEED TO KNOW ABOUT THE DASH EATING PLAN?  For the DASH eating plan, you will follow these general guidelines:  · Choose foods with a percent daily value for sodium of less than 5% (as listed on the food label).  · Use salt-free seasonings or herbs instead of table salt or sea salt.  · Check with your health care provider or pharmacist before using salt substitutes.  · Eat lower-sodium products, often labeled as "lower sodium" or "no salt added."  · Eat fresh foods.  · Eat more vegetables, fruits, and low-fat dairy products.  · Choose whole grains. Look for the word "whole" as the first word in the ingredient list.  · Choose fish and skinless chicken or turkey more often than red meat. Limit fish, poultry, and meat to 6 oz (170 g) each day.  · Limit sweets, desserts, sugars, and sugary drinks.  · Choose heart-healthy fats.  · Limit cheese to 1 oz (28 g) per day.  · Eat more home-cooked food and less restaurant, buffet, and fast food.  · Limit fried foods.  · Cook foods using methods other than frying.  · Limit canned vegetables. If you do use them, rinse them well to decrease the sodium.  · When eating at a restaurant, ask that your food be prepared with less salt, or no salt if possible.  WHAT FOODS CAN I EAT?  Seek help from a dietitian for individual calorie needs.  Grains  Whole grain or whole wheat bread. Brown rice. Whole grain or whole wheat pasta. Quinoa, bulgur, and whole grain cereals. Low-sodium cereals. Corn or whole wheat flour tortillas. Whole grain cornbread. Whole grain crackers. Low-sodium crackers.  Vegetables  Fresh or frozen vegetables  (raw, steamed, roasted, or grilled). Low-sodium or reduced-sodium tomato and vegetable juices. Low-sodium or reduced-sodium tomato sauce and paste. Low-sodium or reduced-sodium canned vegetables.   Fruits  All fresh, canned (in natural juice), or frozen fruits.  Meat and Other Protein Products  Ground beef (85% or leaner), grass-fed beef, or beef trimmed of fat. Skinless chicken or turkey. Ground chicken or turkey. Pork trimmed of fat. All fish and seafood. Eggs. Dried beans, peas, or lentils. Unsalted nuts and seeds. Unsalted canned beans.  Dairy  Low-fat dairy products, such as skim or 1% milk, 2% or reduced-fat cheeses, low-fat ricotta or cottage cheese, or plain low-fat yogurt. Low-sodium or reduced-sodium cheeses.  Fats and Oils  Tub margarines without trans fats. Light or reduced-fat mayonnaise and salad dressings (reduced sodium). Avocado. Safflower, olive, or canola oils. Natural peanut or almond butter.  Other  Unsalted popcorn and pretzels.  The items listed above may not be a complete list of recommended foods or beverages. Contact your dietitian for more options.  WHAT FOODS ARE NOT RECOMMENDED?  Grains  White bread. White pasta. White rice. Refined cornbread. Bagels and croissants. Crackers that contain trans fat.  Vegetables  Creamed or fried vegetables. Vegetables in a cheese sauce. Regular canned vegetables. Regular canned tomato sauce and paste. Regular tomato and vegetable juices.  Fruits  Dried fruits. Canned fruit in light or heavy syrup. Fruit juice.  Meat and Other Protein   Products  Fatty cuts of meat. Ribs, chicken wings, bacon, sausage, bologna, salami, chitterlings, fatback, hot dogs, bratwurst, and packaged luncheon meats. Salted nuts and seeds. Canned beans with salt.  Dairy  Whole or 2% milk, cream, half-and-half, and cream cheese. Whole-fat or sweetened yogurt. Full-fat cheeses or blue cheese. Nondairy creamers and whipped toppings. Processed cheese, cheese spreads, or cheese  curds.  Condiments  Onion and garlic salt, seasoned salt, table salt, and sea salt. Canned and packaged gravies. Worcestershire sauce. Tartar sauce. Barbecue sauce. Teriyaki sauce. Soy sauce, including reduced sodium. Steak sauce. Fish sauce. Oyster sauce. Cocktail sauce. Horseradish. Ketchup and mustard. Meat flavorings and tenderizers. Bouillon cubes. Hot sauce. Tabasco sauce. Marinades. Taco seasonings. Relishes.  Fats and Oils  Butter, stick margarine, lard, shortening, ghee, and bacon fat. Coconut, palm kernel, or palm oils. Regular salad dressings.  Other  Pickles and olives. Salted popcorn and pretzels.  The items listed above may not be a complete list of foods and beverages to avoid. Contact your dietitian for more information.  WHERE CAN I FIND MORE INFORMATION?  National Heart, Lung, and [employer] Institute: www.nhlbi.nih.gov/health/health-topics/topics/dash/     This information is not intended to replace advice given to you by your health care provider. Make sure you discuss any questions you have with your health care provider.     Document Released: 07/31/2011 Document Revised: 09/01/2014 Document Reviewed: 06/15/2013  Elsevier Interactive Patient Education ©2016 [employer]

## 2016-07-01 LAB — HEPATITIS PANEL, ACUTE
HEP A IGM: NEGATIVE
HEP B S AG: NEGATIVE
Hep B C IgM: NEGATIVE
Hep C Virus Ab: <0.1 s/co ratio (ref 0.0–0.9)

## 2016-07-01 LAB — HEPATIC FUNCTION PANEL
ALBUMIN: 4.8 g/dL (ref 3.5–5.5)
ALT: 101 IU/L — ABNORMAL HIGH (ref 0–44)
AST: 43 IU/L — AB (ref 0–40)
Alkaline Phosphatase: 91 IU/L (ref 39–117)
Bilirubin Total: 0.2 mg/dL (ref 0.0–1.2)
Bilirubin, Direct: 0.08 mg/dL (ref 0.00–0.40)
TOTAL PROTEIN: 7.1 g/dL (ref 6.0–8.5)

## 2016-07-01 LAB — CERULOPLASMIN: CERULOPLASMIN: 25.9 mg/dL (ref 16.0–31.0)

## 2016-07-01 LAB — FERRITIN: FERRITIN: 266 ng/mL (ref 30–400)

## 2016-07-01 LAB — ANA: Anti Nuclear Antibody(ANA): NEGATIVE

## 2016-07-03 ENCOUNTER — Ambulatory Visit (HOSPITAL_COMMUNITY)
Admission: RE | Admit: 2016-07-03 | Discharge: 2016-07-03 | Disposition: A | Discharge status: Home / Self Care | Payer: PRIVATE HEALTH INSURANCE | Source: Ambulatory Visit | Attending: Family Medicine | Admitting: Family Medicine

## 2016-07-03 DIAGNOSIS — N281 Cyst of kidney, acquired: Secondary | ICD-10-CM | POA: Diagnosis not present

## 2016-07-03 DIAGNOSIS — R740 Nonspecific elevation of levels of transaminase and lactic acid dehydrogenase [LDH]: Secondary | ICD-10-CM | POA: Diagnosis not present

## 2016-07-03 DIAGNOSIS — K8020 Calculus of gallbladder without cholecystitis without obstruction: Secondary | ICD-10-CM | POA: Diagnosis not present

## 2016-07-04 ENCOUNTER — Other Ambulatory Visit: Payer: Self-pay

## 2016-07-04 DIAGNOSIS — R748 Abnormal levels of other serum enzymes: Secondary | ICD-10-CM

## 2016-07-04 DIAGNOSIS — N281 Cyst of kidney, acquired: Secondary | ICD-10-CM

## 2016-07-09 ENCOUNTER — Encounter: Payer: Self-pay | Admitting: Family Medicine

## 2016-07-14 ENCOUNTER — Encounter: Payer: Self-pay | Admitting: Internal Medicine

## 2016-07-15 ENCOUNTER — Ambulatory Visit (HOSPITAL_COMMUNITY)
Admission: RE | Admit: 2016-07-15 | Discharge: 2016-07-15 | Disposition: A | Discharge status: Home / Self Care | Payer: PRIVATE HEALTH INSURANCE | Source: Ambulatory Visit | Attending: Family Medicine | Admitting: Family Medicine

## 2016-07-15 DIAGNOSIS — K8020 Calculus of gallbladder without cholecystitis without obstruction: Secondary | ICD-10-CM | POA: Insufficient documentation

## 2016-07-15 DIAGNOSIS — N281 Cyst of kidney, acquired: Secondary | ICD-10-CM | POA: Insufficient documentation

## 2016-07-15 DIAGNOSIS — D1803 Hemangioma of intra-abdominal structures: Secondary | ICD-10-CM | POA: Insufficient documentation

## 2016-07-15 MED ORDER — GADOBENATE DIMEGLUMINE 529 MG/ML IV SOLN
15.0000 mL | Freq: Once | INTRAVENOUS | Status: AC | PRN
  Administered 2016-07-15: 15 mL via INTRAVENOUS

## 2016-07-30 ENCOUNTER — Ambulatory Visit (INDEPENDENT_AMBULATORY_CARE_PROVIDER_SITE_OTHER): Payer: PRIVATE HEALTH INSURANCE | Admitting: Nurse Practitioner

## 2016-07-30 ENCOUNTER — Encounter: Payer: Self-pay | Admitting: Nurse Practitioner

## 2016-07-30 DIAGNOSIS — R7989 Other specified abnormal findings of blood chemistry: Secondary | ICD-10-CM | POA: Insufficient documentation

## 2016-07-30 DIAGNOSIS — R1310 Dysphagia, unspecified: Secondary | ICD-10-CM

## 2016-07-30 DIAGNOSIS — R945 Abnormal results of liver function studies: Principal | ICD-10-CM

## 2016-07-30 NOTE — Progress Notes (Signed)
cc'ed to pcp °

## 2016-07-30 NOTE — Progress Notes (Signed)
Primary Care Physician:  Sallee Lange, MD Primary Gastroenterologist:  Dr. Gala Romney  Chief Complaint  Patient presents with  . Elevated Hepatic Enzymes    no sx    HPI:   Jim Hamilton is a 45 y.o. male who presents On referral from primary care for elevated liver enzymes. Last saw primary care 06/30/2016 for well adult exam. PCP notes reviewed. Noted elevated liver enzymes and discussed how this could be fatty liver, hepatitis, or other problems. They arranged for liver ultrasound.  Labs and affect were reviewed which found elevated AST/ALT at 43/101. Bilirubin, direct bilirubin, alkaline phosphatase, albumin all normal. Ferritin normal, antinuclear antibody normal, ceruloplasmin normal, acute hepatitis panel negative. CBC completed 06/27/2016 was normal including hemoglobin of 16.0 and platelet count of 236. A lipid panel on 06/27/2016 was elevated with total cholesterol 247, triglycerides 151, HDL low at 39, LDL high at 178. Fasting glucose typically found to be normal, specifically on 06/27/2016 was 92.  Abdominal ultrasound completed 07/03/2016 found multiple gallstones without evidence of acute cholecystitis, fatty infiltrative changes of the liver. Incidentally noted non-simple appearing parapelvic cyst in the midpole of the left kidney with recommended MRI which was completed on 07/15/2016. This noted of right hepatic lobe hemangioma otherwise no other abnormal liver findings mentioned.  Today he states he has been doing well overall. Since his last PCP he has significnatly changed his diet, cut out sodas, drinks more water, eating fruits and veggies, no fried foods or red meats. Also decreasing starches in his diet. Only has rare GERD symptoms when he eats too late. Denies abdominal pain, N/V, hematochezia, melena, yellowing of skin/eyes, darkened urine, acute episodic confusion, generalized pruritis. LE edema, abdominal swelling. If he eats too fast will have some dysphagia symptoms;  typically no problems if he eats slower and chews his food appropriately; no issues like this in over 3 weeks. Denies chest pain, dyspnea, dizziness, lightheadedness, syncope, near syncope. Denies any other upper or lower GI symptoms.  Significant family history of heart disease.  Past Medical History:  Diagnosis Date  . Hypercholesterolemia   . MRSA infection   . Scrotal abscess     Past Surgical History:  Procedure Laterality Date  . TONSILLECTOMY      No current outpatient prescriptions on file.   No current facility-administered medications for this visit.     Allergies as of 07/30/2016 - Review Complete 07/30/2016  Allergen Reaction Noted  . Tylenol [acetaminophen] Hives 01/06/2013    Family History  Problem Relation Age of Onset  . Multiple sclerosis Mother   . Eczema Daughter   . Colon cancer Neg Hx   . Gastric cancer Neg Hx   . Esophageal cancer Neg Hx   . Liver disease Neg Hx     Social History   Social History  . Marital status: Married    Spouse name: N/A  . Number of children: N/A  . Years of education: N/A   Occupational History  . Not on file.   Social History Main Topics  . Smoking status: Never Smoker  . Smokeless tobacco: Never Used  . Alcohol use Yes     Comment: Once a month (socially)  . Drug use: No  . Sexual activity: Not on file   Other Topics Concern  . Not on file   Social History Narrative  . No narrative on file    Review of Systems: General: Negative for anorexia, weight loss, fever, chills, fatigue, weakness. ENT: Negative for hoarseness. Occasional  dysphagia CV: Negative for chest pain, angina, palpitations, peripheral edema.  Respiratory: Negative for dyspnea at rest, cough, sputum, wheezing.  GI: See history of present illness. MS: Negative for joint pain, low back pain.  Derm: Negative for rash or itching.  Endo: Negative for unusual weight change.  Heme: Negative for bruising or bleeding. Allergy: Negative for  rash or hives.    Physical Exam: BP 126/81   Pulse 90   Temp 97.9 F (36.6 C) (Oral)   Ht 5\' 7"  (1.702 m)   Wt 160 lb (72.6 kg)   BMI 25.06 kg/m  General:   Alert and oriented. Pleasant and cooperative. Well-nourished and well-developed.  Head:  Normocephalic and atraumatic. Eyes:  Without icterus, sclera clear and conjunctiva pink.  Ears:  Normal auditory acuity. Cardiovascular:  S1, S2 present without murmurs appreciated. Extremities without clubbing or edema. Respiratory:  Clear to auscultation bilaterally. No wheezes, rales, or rhonchi. No distress.  Gastrointestinal:  +BS, soft, non-tender and non-distended. No HSM noted. No guarding or rebound. No masses appreciated.  Rectal:  Deferred  Musculoskalatal:  Symmetrical without gross deformities. Neurologic:  Alert and oriented x4;  grossly normal neurologically. Psych:  Alert and cooperative. Normal mood and affect. Heme/Lymph/Immune: No excessive bruising noted.    07/30/2016 3:00 PM   Disclaimer: This note was dictated with voice recognition software. Similar sounding words can inadvertently be transcribed and may not be corrected upon review.

## 2016-07-30 NOTE — Patient Instructions (Signed)
1. Have your labs drawn when you're able to 2. Keep track of swallowing issues so we can look at that at your follow-up appointment 3. Return for follow-up in 3 months

## 2016-07-30 NOTE — Assessment & Plan Note (Signed)
The patient has intermittent dysphagia which she says typically as a result of eating too fast. I have offered upper endoscopy to evaluate for his dysphagia given long-standing GERD as well as accomplish a 1 time screening for Barrett's esophagus given long-standing GERD, white male, near age 45. He is decided to hold off on this for now. He will track his dysphagia symptoms and we will discuss it again in 3 months to decide if he would like an upper endoscopy.

## 2016-07-30 NOTE — Assessment & Plan Note (Signed)
Elevated transaminases with AST less than 2 times upper limit normal, ALT between 2 and 5 times upper limit normal. At this point based on guidelines and recommendations I will recheck CBC, CMP. I will check PT/INR, hepatitis B surface antigen, hepatitis B core antibody, hepatitis B surface antibody. Hepatitis C is artery been checked. I will also check a ferritin and iron. Abdominal ultrasound is artery been completed as per history of present illness. Return for follow-up in 3 months. Continue dietary changes and exercise.

## 2016-10-28 ENCOUNTER — Ambulatory Visit: Payer: PRIVATE HEALTH INSURANCE | Admitting: Nurse Practitioner

## 2016-10-28 ENCOUNTER — Telehealth: Payer: Self-pay | Admitting: Nurse Practitioner

## 2016-10-28 ENCOUNTER — Encounter: Payer: Self-pay | Admitting: Nurse Practitioner

## 2016-10-28 NOTE — Telephone Encounter (Signed)
Patient was a no show and letter sent  °

## 2016-10-28 NOTE — Telephone Encounter (Signed)
Noted  

## 2017-07-08 ENCOUNTER — Encounter: Payer: Self-pay | Admitting: Family Medicine

## 2017-07-08 ENCOUNTER — Ambulatory Visit (INDEPENDENT_AMBULATORY_CARE_PROVIDER_SITE_OTHER): Payer: PRIVATE HEALTH INSURANCE | Admitting: Family Medicine

## 2017-07-08 VITALS — BP 106/68 | Ht 67.0 in | Wt 152.8 lb

## 2017-07-08 DIAGNOSIS — Z13220 Encounter for screening for lipoid disorders: Secondary | ICD-10-CM

## 2017-07-08 DIAGNOSIS — Z0000 Encounter for general adult medical examination without abnormal findings: Secondary | ICD-10-CM | POA: Diagnosis not present

## 2017-07-08 DIAGNOSIS — Z131 Encounter for screening for diabetes mellitus: Secondary | ICD-10-CM

## 2017-07-08 DIAGNOSIS — R748 Abnormal levels of other serum enzymes: Secondary | ICD-10-CM

## 2017-07-08 NOTE — Progress Notes (Signed)
   Subjective:    Patient ID: Jim Hamilton, male    DOB: Jun 12, 1971, 46 y.o.   MRN: [id number]  HPI The patient comes in today for a wellness visit.  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  A review of medications was also completed.  Any needed refills; no  Eating habits: trying to eat healthy  Falls/  MVA accidents in past few months:none  Regular exercise: none  Specialist pt sees on regular basis: no  Preventative health issues were discussed.   Additional concerns: none Patient does do some exercise He does try to eat healthy He rarely drinks alcohol Does not smoke Does not use any drugs He does have a history of fatty liver he is uncertain what his liver enzymes are doing currently   Review of Systems  Constitutional: Negative for activity change, appetite change and fever.  HENT: Negative for congestion and rhinorrhea.   Eyes: Negative for discharge.  Respiratory: Negative for cough and wheezing.   Cardiovascular: Negative for chest pain.  Gastrointestinal: Negative for abdominal pain, blood in stool and vomiting.  Genitourinary: Negative for difficulty urinating and frequency.  Musculoskeletal: Negative for neck pain.  Skin: Negative for rash.  Allergic/Immunologic: Negative for environmental allergies and food allergies.  Neurological: Negative for weakness and headaches.  Psychiatric/Behavioral: Negative for agitation.       Objective:   Physical Exam  Constitutional: He appears well-developed and well-nourished.  HENT:  Head: Normocephalic and atraumatic.  Right Ear: External ear normal.  Left Ear: External ear normal.  Nose: Nose normal.  Mouth/Throat: Oropharynx is clear and moist.  Eyes: EOM are normal. Pupils are equal, round, and reactive to light.  Neck: Normal range of motion. Neck supple. No thyromegaly present.  Cardiovascular: Normal rate,  regular rhythm and normal heart sounds.  No murmur heard. Pulmonary/Chest: Effort normal and breath sounds normal. No respiratory distress. He has no wheezes.  Abdominal: Soft. Bowel sounds are normal. He exhibits no distension and no mass. There is no tenderness.  Genitourinary: Penis normal.  Musculoskeletal: Normal range of motion. He exhibits no edema.  Lymphadenopathy:    He has no cervical adenopathy.  Neurological: He is alert. He exhibits normal muscle tone.  Skin: Skin is warm and dry. No erythema.  Psychiatric: He has a normal mood and affect. His behavior is normal. Judgment normal.   Genital exam normal testicles normal patient complains of dry skin underneath the scrotum I would recommend lotions OTC       Assessment & Plan:  Adult wellness-complete.wellness physical was conducted today. Importance of diet and exercise were discussed in detail. In addition to this a discussion regarding safety was also covered. We also reviewed over immunizations and gave recommendations regarding current immunization needed for age. In addition to this additional areas were also touched on including: Preventative health exams needed: Colonoscopy colonoscopy is not indicated   Patient was advised yearly wellness exam Lab work indicated await the results may need further investigation regarding fatty liver.

## 2017-07-11 LAB — BASIC METABOLIC PANEL
BUN / CREAT RATIO: 17 (ref 9–20)
BUN: 18 mg/dL (ref 6–24)
CO2: 28 mmol/L (ref 20–29)
CREATININE: 1.04 mg/dL (ref 0.76–1.27)
Calcium: 10.3 mg/dL — ABNORMAL HIGH (ref 8.7–10.2)
Chloride: 102 mmol/L (ref 96–106)
GFR calc non Af Amer: 86 mL/min/{1.73_m2} (ref >59)
GFR, EST AFRICAN AMERICAN: 100 mL/min/{1.73_m2} (ref >59)
GLUCOSE: 81 mg/dL (ref 65–99)
Potassium: 5.2 mmol/L (ref 3.5–5.2)
Sodium: 142 mmol/L (ref 134–144)

## 2017-07-11 LAB — LIPID PANEL
CHOL/HDL RATIO: 5.6 ratio — AB (ref 0.0–5.0)
Cholesterol, Total: 252 mg/dL — ABNORMAL HIGH (ref 100–199)
HDL: 45 mg/dL (ref >39)
LDL Calculated: 183 mg/dL — ABNORMAL HIGH (ref 0–99)
Triglycerides: 121 mg/dL (ref 0–149)
VLDL Cholesterol Cal: 24 mg/dL (ref 5–40)

## 2017-07-11 LAB — HEPATIC FUNCTION PANEL
ALT: 51 IU/L — ABNORMAL HIGH (ref 0–44)
AST: 28 IU/L (ref 0–40)
Albumin: 4.9 g/dL (ref 3.5–5.5)
Alkaline Phosphatase: 77 IU/L (ref 39–117)
BILIRUBIN TOTAL: 0.4 mg/dL (ref 0.0–1.2)
BILIRUBIN, DIRECT: 0.08 mg/dL (ref 0.00–0.40)
TOTAL PROTEIN: 7.5 g/dL (ref 6.0–8.5)

## 2017-07-17 NOTE — Addendum Note (Signed)
Addended by: Dairl Ponder on: 07/17/2017 04:07 PM   Modules accepted: Orders

## 2017-08-05 ENCOUNTER — Encounter: Payer: Self-pay | Admitting: Family Medicine

## 2017-08-05 ENCOUNTER — Ambulatory Visit: Payer: PRIVATE HEALTH INSURANCE | Admitting: Family Medicine

## 2017-08-05 VITALS — BP 110/64 | Temp 98.0°F | Ht 67.0 in | Wt 154.0 lb

## 2017-08-05 DIAGNOSIS — L0292 Furuncle, unspecified: Secondary | ICD-10-CM

## 2017-08-05 DIAGNOSIS — L0291 Cutaneous abscess, unspecified: Secondary | ICD-10-CM

## 2017-08-05 MED ORDER — DOXYCYCLINE HYCLATE 100 MG PO TABS: 100.0000 mg | ORAL_TABLET | Freq: Two times a day (BID) | ORAL | 0 refills | Status: DC

## 2017-08-05 NOTE — Progress Notes (Signed)
   Subjective:    Patient ID: Jim Hamilton, male    DOB: 1971-05-13, 46 y.o.   MRN: [id number]  HPI  Patient is here today for a boil on the inside of his right upper leg. Noticed this the past Saturday. He states he has a history of them. This has started to ooze a bloody milky discharge. He wants to make sure it does not become infected. Patient relates that he has a area that is causing increased pain and discomfort at the base of the scrotum draining some relates pain discomfort swelling redness denies fever chills sweats Review of Systems Denies abdominal pain chest pain shortness of breath see above    Objective:   Physical Exam Lungs clear heart regular scrotal region has an abscess with cellulitis it is draining approximately 1-1/2 inches in diameter  Procedure-with patient consent the area was cleansed with rubbing alcohol then injected with 1% lidocaine with epinephrine No. 11 blade was used to obtain significant drainage       Assessment & Plan:  Abscess Drained Antibiotics as directed Warm compresses frequently Warm soaks frequently, follow-up if progressive troubles or if worse  This should gradually get better with time if not patient will notify us

## 2017-08-10 LAB — WOUND CULTURE

## 2017-08-10 LAB — SPECIMEN STATUS REPORT

## 2017-08-26 ENCOUNTER — Encounter: Payer: Self-pay | Admitting: Family Medicine

## 2017-08-26 ENCOUNTER — Ambulatory Visit: Payer: PRIVATE HEALTH INSURANCE | Admitting: Family Medicine

## 2017-08-26 VITALS — BP 126/74 | Temp 97.8°F | Ht 67.0 in | Wt 151.0 lb

## 2017-08-26 DIAGNOSIS — J019 Acute sinusitis, unspecified: Secondary | ICD-10-CM

## 2017-08-26 DIAGNOSIS — J029 Acute pharyngitis, unspecified: Secondary | ICD-10-CM | POA: Diagnosis not present

## 2017-08-26 DIAGNOSIS — B9689 Other specified bacterial agents as the cause of diseases classified elsewhere: Secondary | ICD-10-CM | POA: Diagnosis not present

## 2017-08-26 LAB — POCT RAPID STREP A (OFFICE): RAPID STREP A SCREEN: NEGATIVE

## 2017-08-26 MED ORDER — AMOXICILLIN-POT CLAVULANATE 875-125 MG PO TABS
ORAL_TABLET | ORAL | 0 refills | Status: DC
Start: 2017-08-26 — End: 2017-11-12

## 2017-08-26 NOTE — Progress Notes (Signed)
   Subjective:    Patient ID: Jim Hamilton, male    DOB: 1971-06-02, 47 y.o.   MRN: [id number]  Sore Throat   This is a new problem. The current episode started yesterday. Associated symptoms include congestion. He has had exposure to strep. Exposure to: wife and daughter both have strep. He has tried nothing for the symptoms.   Results for orders placed or performed in visit on 08/26/17  POCT rapid strep A  Result Value Ref Range   Rapid Strep A Screen Negative Negative   Oldest daught er had sore throat  Los her bovoice  Youngest daught er came down with   pos strep and eye ischarge  Took care of wife with it  Pos inflammed and hurting    Cong   Some nose runing no ajor he a  No eye disch  No ear disfort  l  Review of Systems  HENT: Positive for congestion.        Objective:   Physical Exam  Alert, mild malaise. Hydration good Vitals stable. frontal/ maxillary tenderness evident positive nasal congestion. pharynx normal neck supple  lungs clear/no crackles or wheezes. heart regular in rhythm       Assessment & Plan:  Impression rhinosinusitis likely post viral, discussed with patient. plan antibiotics prescribed. Questions answered. Symptomatic care discussed. warning signs discussed. WSL

## 2017-11-12 ENCOUNTER — Encounter: Payer: Self-pay | Admitting: Family Medicine

## 2017-11-12 ENCOUNTER — Ambulatory Visit: Payer: PRIVATE HEALTH INSURANCE | Admitting: Family Medicine

## 2017-11-12 VITALS — BP 134/82 | Temp 98.0°F | Ht 67.0 in | Wt 154.0 lb

## 2017-11-12 DIAGNOSIS — J019 Acute sinusitis, unspecified: Secondary | ICD-10-CM

## 2017-11-12 DIAGNOSIS — B9689 Other specified bacterial agents as the cause of diseases classified elsewhere: Secondary | ICD-10-CM

## 2017-11-12 MED ORDER — AMOXICILLIN-POT CLAVULANATE 875-125 MG PO TABS: ORAL_TABLET | ORAL | 0 refills | Status: DC

## 2017-11-12 NOTE — Progress Notes (Signed)
   Subjective:    Patient ID: Jim Hamilton, male    DOB: 1971-06-04, 47 y.o.   MRN: [id number]  Sinusitis  This is a new problem. The current episode started in the past 7 days. Maximum temperature: has not checked fever pt stated he felt warm. Associated symptoms include congestion, coughing, ear pain and a sore throat. Pertinent negatives include no chills. (Vomiting, wheezing, runny nose, fever, abdominal pain, sweats, body pain, tingling on right side mostly then goes all over body (hot flash?), dizziness, fatigue, not sleeping well) Treatments tried: advil cold and sinus. The treatment provided mild (med works until it wears off) relief.   He describes the symptoms been present over the past week  He also relates intermittent tingling numbness that goes from the right side of his head down the right side of his neck down to the arm is only occurred a couple times over the past few months he denies any type of injury that could have caused it denies any type of headaches nausea vomiting double vision states his energy level overall is doing okay   Review of Systems  Constitutional: Negative for activity change, chills and fever.  HENT: Positive for congestion, ear pain, rhinorrhea and sore throat.   Eyes: Negative for discharge.  Respiratory: Positive for cough. Negative for wheezing.   Cardiovascular: Negative for chest pain.  Gastrointestinal: Negative for nausea and vomiting.  Musculoskeletal: Negative for arthralgias.       Objective:   Physical Exam  Constitutional: He appears well-developed.  HENT:  Head: Normocephalic and atraumatic.  Mouth/Throat: Oropharynx is clear and moist. No oropharyngeal exudate.  Eyes: Right eye exhibits no discharge. Left eye exhibits no discharge.  Neck: Normal range of motion.  Cardiovascular: Normal rate, regular rhythm and normal heart sounds.  No murmur heard. Pulmonary/Chest: Effort normal and breath sounds normal. No respiratory distress. He  has no wheezes. He has no rales.  Lymphadenopathy:    He has no cervical adenopathy.  Neurological: He exhibits normal muscle tone.  Skin: Skin is warm and dry.  Nursing note and vitals reviewed.         Assessment & Plan:  Patient was seen today for upper respiratory illness. It is felt that the patient is dealing with sinusitis. Antibiotics were prescribed today. Importance of compliance with medication was discussed. Symptoms should gradually resolve over the course of the next several days. If high fevers, progressive illness, difficulty breathing, worsening condition or failure for symptoms to improve over the next several days then the patient is to follow-up. If any emergent conditions the patient is to follow-up in the emergency department otherwise to follow-up in the office.  Probably has underlying virus as well that may take a little bit of time for it to go away  Unusual numbness sensation as described above the patient was encouraged to do a follow-up visit with [place] somewhere in the next couple months keep track of all his findings plus also do his lab work for his elevated liver enzymes

## 2017-11-26 ENCOUNTER — Ambulatory Visit (HOSPITAL_COMMUNITY)
Admission: AD | Admit: 2017-11-26 | Discharge: 2017-11-26 | Disposition: A | Discharge status: Home / Self Care | Payer: PRIVATE HEALTH INSURANCE | Source: Ambulatory Visit | Attending: Internal Medicine | Admitting: Internal Medicine

## 2017-11-26 ENCOUNTER — Encounter (HOSPITAL_COMMUNITY)
Admission: AD | Disposition: A | Discharge status: Home / Self Care | Payer: Self-pay | Source: Ambulatory Visit | Attending: Internal Medicine

## 2017-11-26 ENCOUNTER — Other Ambulatory Visit: Payer: Self-pay

## 2017-11-26 ENCOUNTER — Encounter (HOSPITAL_COMMUNITY): Payer: Self-pay | Admitting: Anesthesiology

## 2017-11-26 ENCOUNTER — Encounter: Payer: Self-pay | Admitting: Family Medicine

## 2017-11-26 ENCOUNTER — Other Ambulatory Visit: Payer: Self-pay | Admitting: Gastroenterology

## 2017-11-26 ENCOUNTER — Ambulatory Visit (HOSPITAL_COMMUNITY): Payer: PRIVATE HEALTH INSURANCE | Admitting: Anesthesiology

## 2017-11-26 ENCOUNTER — Ambulatory Visit: Payer: PRIVATE HEALTH INSURANCE | Admitting: Family Medicine

## 2017-11-26 VITALS — BP 118/80 | Temp 98.2°F | Ht 67.0 in | Wt 152.1 lb

## 2017-11-26 DIAGNOSIS — K222 Esophageal obstruction: Secondary | ICD-10-CM | POA: Insufficient documentation

## 2017-11-26 DIAGNOSIS — T18128A Food in esophagus causing other injury, initial encounter: Secondary | ICD-10-CM

## 2017-11-26 DIAGNOSIS — K209 Esophagitis, unspecified: Secondary | ICD-10-CM | POA: Diagnosis not present

## 2017-11-26 DIAGNOSIS — K2210 Ulcer of esophagus without bleeding: Secondary | ICD-10-CM | POA: Insufficient documentation

## 2017-11-26 DIAGNOSIS — Z8614 Personal history of Methicillin resistant Staphylococcus aureus infection: Secondary | ICD-10-CM | POA: Diagnosis not present

## 2017-11-26 DIAGNOSIS — X58XXXA Exposure to other specified factors, initial encounter: Secondary | ICD-10-CM | POA: Insufficient documentation

## 2017-11-26 DIAGNOSIS — K210 Gastro-esophageal reflux disease with esophagitis: Secondary | ICD-10-CM | POA: Insufficient documentation

## 2017-11-26 DIAGNOSIS — T18108A Unspecified foreign body in esophagus causing other injury, initial encounter: Secondary | ICD-10-CM

## 2017-11-26 DIAGNOSIS — K449 Diaphragmatic hernia without obstruction or gangrene: Secondary | ICD-10-CM | POA: Insufficient documentation

## 2017-11-26 HISTORY — PX: ESOPHAGOGASTRODUODENOSCOPY (EGD) WITH PROPOFOL: SHX5813

## 2017-11-26 SURGERY — ESOPHAGOGASTRODUODENOSCOPY (EGD) WITH PROPOFOL
Anesthesia: Monitor Anesthesia Care

## 2017-11-26 MED ORDER — MIDAZOLAM HCL 2 MG/2ML IJ SOLN
INTRAMUSCULAR | Status: AC
  Filled 2017-11-26: qty 2

## 2017-11-26 MED ORDER — PROPOFOL 500 MG/50ML IV EMUL
INTRAVENOUS | Status: DC | PRN
  Administered 2017-11-26: 100 ug/kg/min via INTRAVENOUS

## 2017-11-26 MED ORDER — BUTAMBEN-TETRACAINE-BENZOCAINE 2-2-14 % EX AERO
INHALATION_SPRAY | CUTANEOUS | Status: DC | PRN
  Administered 2017-11-26: 2 via TOPICAL

## 2017-11-26 MED ORDER — CHLORHEXIDINE GLUCONATE CLOTH 2 % EX PADS: 6.0000 | MEDICATED_PAD | Freq: Once | CUTANEOUS | Status: DC

## 2017-11-26 MED ORDER — BUTAMBEN-TETRACAINE-BENZOCAINE 2-2-14 % EX AERO
INHALATION_SPRAY | CUTANEOUS | Status: AC
  Filled 2017-11-26: qty 5

## 2017-11-26 MED ORDER — MIDAZOLAM HCL 5 MG/5ML IJ SOLN
INTRAMUSCULAR | Status: DC | PRN
  Administered 2017-11-26: 2 mg via INTRAVENOUS

## 2017-11-26 MED ORDER — LACTATED RINGERS IV SOLN
INTRAVENOUS | Status: DC
  Administered 2017-11-26: 50 mL via INTRAVENOUS

## 2017-11-26 MED ORDER — STERILE WATER FOR IRRIGATION IR SOLN
Status: DC | PRN
  Administered 2017-11-26: 15 mL

## 2017-11-26 MED ORDER — LIDOCAINE HCL 2 % EX GEL
CUTANEOUS | Status: AC
  Filled 2017-11-26: qty 30

## 2017-11-26 NOTE — Progress Notes (Signed)
   Subjective:    Patient ID: Jim Hamilton, male    DOB: [birth year], 47 y.o.   MRN: [id number]  HPI  Patient is here today with complaints of being unable to [place]. Had went out to eat this evening and food is stuck still stuck in throat he says she tried to drink to make it go down ,but felt he was unable to breath and ran to the restroom and threw up.Has a history of reflux. Has seen GI Dr. At [facility] gastro for the same thing in the past. Was told to watch diet and was give a antireflux medication, he states it got better and he no longer takes the antireflux med. He states he feels a pressure in the center of the sternum where he thinks the food is stuck.  Review of Systems  Constitutional: Negative for activity change.  HENT: Negative for congestion and rhinorrhea.   Respiratory: Negative for cough and shortness of breath.   Cardiovascular: Negative for chest pain.  Gastrointestinal: Positive for nausea and vomiting. Negative for abdominal pain and diarrhea.  Genitourinary: Negative for dysuria and hematuria.  Neurological: Negative for weakness and headaches.  Psychiatric/Behavioral: Negative for confusion.       Objective:   Physical Exam  Constitutional: He appears well-nourished.  Cardiovascular: Normal rate, regular rhythm and normal heart sounds.  No murmur heard. Pulmonary/Chest: Effort normal and breath sounds normal.  Musculoskeletal: He exhibits no edema.  Lymphadenopathy:    He has no cervical adenopathy.  Neurological: He is alert.  Psychiatric: His behavior is normal.  Vitals reviewed.         Assessment & Plan:  Food impaction GI and ER discussed case May need EGD

## 2017-11-26 NOTE — H&P (Signed)
@LOGO @   Primary Care Physician:  [name], MD Primary Gastroenterologist:  Dr. Tommy Medal  Pre-Procedure History & Physical: HPI:  Jim Hamilton is a 47 y.o. male here for urgent evaluation of food impaction. His long history of GERD intermittent esophageal dysphagia with no prior evaluation present although previously recommended). Seen by [place] previously. Previously on acid suppression therapy but he came off that medication. History of mildly elevated LFTs.  Patient states he swallowed some chicken noontime today and failed hanging by his breast bone he's not been able swallow any fluids including his saliva ever since. He presented to Dr. licking. He called [place]. We arranged for him to come up to be evaluated urgently. Patient denies tobacco or alcohol use.   Past Medical History:  Diagnosis Date  . Hypercholesterolemia   . MRSA infection   . Scrotal abscess     Past Surgical History:  Procedure Laterality Date  . TONSILLECTOMY      Prior to Admission medications   Not on File    Allergies as of 11/26/2017 - Review Complete 11/26/2017  Allergen Reaction Noted  . Tylenol [acetaminophen] Hives 01/06/2013    Family History  Problem Relation Age of Onset  . Multiple sclerosis Mother   . Heart disease Father   . Eczema Daughter   . Colon cancer Neg Hx   . Gastric cancer Neg Hx   . Esophageal cancer Neg Hx   . Liver disease Neg Hx     Social History   Socioeconomic History  . Marital status: Married    Spouse name: Not on file  . Number of children: Not on file  . Years of education: Not on file  . Highest education level: Not on file  Occupational History  . Not on file  Social Needs  . Financial resource strain: Not on file  . Food insecurity:    Worry: Not on file    Inability: Not on file  . Transportation needs:    Medical: Not on file    Non-medical: Not on file  Tobacco Use  . Smoking status: Never Smoker  . Smokeless tobacco: Never Used   Substance and Sexual Activity  . Alcohol use: Yes    Comment: Once a month (socially)  . Drug use: No  . Sexual activity: Not on file  Lifestyle  . Physical activity:    Days per week: Not on file    Minutes per session: Not on file  . Stress: Not on file  Relationships  . Social connections:    Talks on phone: Not on file    Gets together: Not on file    Attends religious service: Not on file    Active member of club or organization: Not on file    Attends meetings of clubs or organizations: Not on file    Relationship status: Not on file  . Intimate partner violence:    Fear of current or ex partner: Not on file    Emotionally abused: Not on file    Physically abused: Not on file    Forced sexual activity: Not on file  Other Topics Concern  . Not on file  Social History Narrative  . Not on file    Review of Systems: See HPI, otherwise negative ROS  Physical Exam: There were no vitals taken for this visit. General:   Alert,   pleasant and cooperative in NAD Neck:  Supple; no masses or thyromegaly. No significant cervical adenopathy. Lungs:  Clear  throughout to auscultation.   No wheezes, crackles, or rhonchi. No acute distress. Heart:  Regular rate and rhythm; no murmurs, clicks, rubs,  or gallops. Abdomen: Non-distended, normal bowel sounds.  Soft and nontender without appreciable mass or hepatosplenomegaly.  Pulses:  Normal pulses noted. Extremities:  Without clubbing or edema.  Impression: Pleasant 47 year old gentleman long-standing GERD, intermittent esophageal dysphagia presents with signs and symptoms consistent with esophageal food impaction. A peptic stricture, ring or web likely. Cannot exclude EOE. I doubt underlying malignancy with no significant alcohol or tobacco exposure.     Recommendations: I have offered  the patient an EGD with disimpaction today. I told him that if his upper GI tract was not empty and/or his esophagus was significantly inflamed  from the food impaction, we would not be able to perform dilation today. That would best be approached at a later date electively.  The risks, benefits, limitations, alternatives and imponderables have been reviewed with the patient. Potential for esophageal dilation, biopsy, etc. have also been reviewed.  Questions have been answered. All parties agreeable.  Further recommendations to follow.     Notice: This dictation was prepared with Dragon dictation along with smaller phrase technology. Any transcriptional errors that result from this process are unintentional and may not be corrected upon review.

## 2017-11-26 NOTE — Transfer of Care (Signed)
Immediate Anesthesia Transfer of Care Note  Patient: Jim Hamilton  Procedure(s) Performed: ESOPHAGOGASTRODUODENOSCOPY (EGD) WITH PROPOFOL (N/A )  Patient Location: PACU  Anesthesia Type:MAC  Level of Consciousness: awake and patient cooperative  Airway & Oxygen Therapy: Patient Spontanous Breathing and Patient connected to face mask oxygen  Post-op Assessment: Report given to RN, Post -op Vital signs reviewed and stable and Patient moving all extremities  Post vital signs: Reviewed and stable  Last Vitals:  Vitals Value Taken Time  BP    Temp    Pulse    Resp    SpO2      Last Pain:  Vitals:   11/26/17 1619  TempSrc: Oral         Complications: No apparent anesthesia complications

## 2017-11-26 NOTE — Anesthesia Preprocedure Evaluation (Signed)
Anesthesia Evaluation  Patient identified by MRN, date of birth, ID band Patient awake    Reviewed: Allergy & Precautions, NPO status , Patient's Chart, lab work & pertinent test results  Airway Mallampati: II  TM Distance: >3 FB Neck ROM: Full    Dental no notable dental hx.    Pulmonary    Pulmonary exam normal breath sounds clear to auscultation       Cardiovascular Normal cardiovascular exam Rhythm:Regular Rate:Normal     Neuro/Psych    GI/Hepatic   Endo/Other    Renal/GU      Musculoskeletal   Abdominal   Peds  Hematology   Anesthesia Other Findings   Reproductive/Obstetrics                             Anesthesia Physical Anesthesia Plan  ASA: II and emergent  Anesthesia Plan: MAC   Post-op Pain Management:    Induction: Intravenous  PONV Risk Score and Plan:   Airway Management Planned: Nasal Cannula  Additional Equipment:   Intra-op Plan:   Post-operative Plan:   Informed Consent: I have reviewed the patients History and Physical, chart, labs and discussed the procedure including the risks, benefits and alternatives for the proposed anesthesia with the patient or authorized representative who has indicated his/her understanding and acceptance.     Plan Discussed with: CRNA  Anesthesia Plan Comments:         Anesthesia Quick Evaluation

## 2017-11-26 NOTE — Op Note (Signed)
[facility] Patient Name: Jim Hamilton Procedure Date: 11/26/2017 4:08 PM MRN: [id number] Date of Birth: 10-20-1970 Attending MD: Norvel Richards , MD CSN: [id number] Age: 47 Admit Type: Outpatient Procedure:                Upper GI endoscopy Indications:              Foreign body in the esophagus Providers:                Norvel Richards, MD, Charlsie Quest. Theda Sers RN, RN,                            Lurline Del, RN Referring MD:              Medicines:                Propofol per Anesthesia Complications:            No immediate complications. Estimated Blood Loss:     Estimated blood loss: none. Procedure:                Pre-Anesthesia Assessment:                           - Prior to the procedure, a History and Physical                            was performed, and patient medications and                            allergies were reviewed. The patient's tolerance of                            previous anesthesia was also reviewed. The risks                            and benefits of the procedure and the sedation                            options and risks were discussed with the patient.                            All questions were answered, and informed consent                            was obtained. Prior Anticoagulants: The patient has                            taken no previous anticoagulant or antiplatelet                            agents. ASA Grade Assessment: II - A patient with                            mild systemic disease. After reviewing the risks  and benefits, the patient was deemed in                            satisfactory condition to undergo the procedure.                           After obtaining informed consent, the endoscope was                            passed under direct vision. Throughout the                            procedure, the patient's blood pressure, pulse, and                            oxygen saturations  were monitored continuously. The                            [id number]) scope was introduced through the                            mouth, and advanced to the gastric cardia. The                            upper GI endoscopy was accomplished without                            difficulty. The patient tolerated the procedure                            well. Scope In: 4:38:53 PM Scope Out: 4:41:31 PM Total Procedure Duration: 0 hours 2 minutes 38 seconds  Findings:      Esophagitis was found. circumferential distal esophageal erosins with in       5 mm of the GEJ. Schatzki's ring present. Meat bolus ball-valving in the       distal esophagus. With scope advancement, the meat bolus fell into the       hiatal hernia Patient had quite a bit of fluid and a small hernia sac       with food debris. The distal esophagus was inflamed. No attempt at       completing the EGD or performing esophageal dilation made today.      A small hiatal hernia was present. Impression:               - Erosive reflux esophagitis. Esophageal food                            impaction. status post disimpaction. Schatzkis ring                           - Small hiatal hernia.                           - No specimens collected. Moderate Sedation:      Moderate (conscious) sedation was personally administered by an  anesthesia professional. The following parameters were monitored: oxygen       saturation, heart rate, blood pressure, respiratory rate, EKG, adequacy       of pulmonary ventilation, and response to care. Total physician       intraservice time was 13 minutes. Recommendation:           - Patient has a contact number available for                            emergencies. The signs and symptoms of potential                            delayed complications were discussed with the                            patient. Return to normal activities tomorrow.                            Written discharge  instructions were provided to the                            patient.                           - Chopped diet. Begin Protonix 40 mg daily. My                            office will be in touch to schedule an elective EGD                            with esophageal dilation in the near future.                           - Continue present medications.                           - Repeat upper endoscopy in 2 weeks. Procedure Code(s):        --- Professional ---                           17793, Esophagoscopy, flexible, transoral;                            diagnostic, including collection of specimen(s) by                            brushing or washing, when performed (separate                            procedure) Diagnosis Code(s):        --- Professional ---                           K20.9, Esophagitis, unspecified  K44.9, Diaphragmatic hernia without obstruction or                            gangrene                           T18.108A, Unspecified foreign body in esophagus                            causing other injury, initial encounter CPT copyright 2017 [employer]. All rights reserved. The codes documented in this report are preliminary and upon coder review may  be revised to meet current compliance requirements. Cristopher Estimable. Kassey Laforest, MD Norvel Richards, MD 11/26/2017 4:57:02 PM This report has been signed electronically. Number of Addenda: 0

## 2017-11-26 NOTE — Discharge Instructions (Signed)
EGD Discharge instructions Please read the instructions outlined below and refer to this sheet in the next few weeks. These discharge instructions provide you with general information on caring for yourself after you leave the hospital. Your doctor may also give you specific instructions. While your treatment has been planned according to the most current medical practices available, unavoidable complications occasionally occur. If you have any problems or questions after discharge, please call your doctor. ACTIVITY  You may resume your regular activity but move at a slower pace for the next 24 hours.   Take frequent rest periods for the next 24 hours.   Walking will help expel (get rid of) the air and reduce the bloated feeling in your abdomen.   No driving for 24 hours (because of the anesthesia (medicine) used during the test).   You may shower.   Do not sign any important legal documents or operate any machinery for 24 hours (because of the anesthesia used during the test).  NUTRITION  Drink plenty of fluids.   You may resume your normal diet.   Begin with a light meal and progress to your normal diet.   Avoid alcoholic beverages for 24 hours or as instructed by your caregiver.  MEDICATIONS  You may resume your normal medications unless your caregiver tells you otherwise.  WHAT YOU CAN EXPECT TODAY  You may experience abdominal discomfort such as a feeling of fullness or gas pains.  FOLLOW-UP  Your doctor will discuss the results of your test with you.  SEEK IMMEDIATE MEDICAL ATTENTION IF ANY OF THE FOLLOWING OCCUR:  Excessive nausea (feeling sick to your stomach) and/or vomiting.   Severe abdominal pain and distention (swelling).   Trouble swallowing.   Temperature over 101 F (37.8 C).   Rectal bleeding or vomiting of blood.     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 The ring is related to  reflux and has caused narrowing in your esophagus. I removed the chicken from your esophagus.  Your esophagus could not be dilated today  You should limit meat and bread. If you eat meat it, should be chopped up and thoroughly chewed for now.  Begin Protonix 40 mg daily for acid reflux  My office staff will be in contact with you to schedule an elective upper endoscopy to dilate your esophagus in the near future.

## 2017-11-26 NOTE — Anesthesia Postprocedure Evaluation (Signed)
Anesthesia Post Note  Patient: Jim Hamilton  Procedure(s) Performed: ESOPHAGOGASTRODUODENOSCOPY (EGD) WITH PROPOFOL (N/A )  Patient location during evaluation: PACU Anesthesia Type: MAC Level of consciousness: awake and alert and patient cooperative Pain management: pain level controlled Vital Signs Assessment: post-procedure vital signs reviewed and stable Respiratory status: spontaneous breathing, nonlabored ventilation and respiratory function stable Cardiovascular status: blood pressure returned to baseline Postop Assessment: no apparent nausea or vomiting Anesthetic complications: no     Last Vitals:  Vitals:   11/26/17 1619  BP: 128/88  Pulse: 84  Resp: 18  Temp: 36.7 C  SpO2: 100%    Last Pain:  Vitals:   11/26/17 1619  TempSrc: Oral                 Letishia Elliott J

## 2017-12-02 ENCOUNTER — Encounter (HOSPITAL_COMMUNITY): Payer: Self-pay | Admitting: Internal Medicine

## 2018-01-22 ENCOUNTER — Ambulatory Visit: Payer: PRIVATE HEALTH INSURANCE | Admitting: Family Medicine

## 2018-01-22 ENCOUNTER — Encounter: Payer: Self-pay | Admitting: Family Medicine

## 2018-01-22 VITALS — BP 116/70 | Ht 67.0 in | Wt 147.0 lb

## 2018-01-22 DIAGNOSIS — M25562 Pain in left knee: Secondary | ICD-10-CM | POA: Diagnosis not present

## 2018-01-22 DIAGNOSIS — F320 Major depressive disorder, single episode, mild: Secondary | ICD-10-CM | POA: Insufficient documentation

## 2018-01-22 MED ORDER — SERTRALINE HCL 50 MG PO TABS: 50.0000 mg | ORAL_TABLET | Freq: Every day | ORAL | 3 refills | Status: DC

## 2018-01-22 NOTE — Progress Notes (Signed)
   Subjective:    Patient ID: Jim Hamilton, male    DOB: 05/27/1971, 47 y.o.   MRN: [id number]  Depression         This is a new problem.  Episode onset: one - two months.   Associated symptoms include decreased concentration, insomnia and appetite change.( crying) This patient relates over the past several months increased sadness feeling down crying spells lack of interest denies being suicidal.  PHQ 9 was 12.  Patient has been going through a separation.  This is hit him hard.  His separated wife has now started seeing someone which is also hitting him hard.  He does do still care for his wife hopes it works out and cares for his child   Office Visit from 01/22/2018 in [place]  PHQ-9 Total Score  12      Left knee swelling. Started 2 weeks ago.  Patient relates intermittent swelling in the left knee started couple weeks ago denies any type of injuries or activities that could have triggered this.  Does not give way does not lock up on him  Review of Systems  Constitutional: Positive for appetite change.  Psychiatric/Behavioral: Positive for decreased concentration and depression. The patient has insomnia.        Objective:   Physical Exam Lungs clear respiratory rate normal heart regular no murmurs HEENT benign patient is calm and able to talk logically  The knee there is no ligament interruption no sign of any cartilage or large ligament/major ligament issues     Assessment & Plan:  Knee pain use anti-inflammatories as needed stretching exercises if ongoing trouble notify us we may need to set up orthopedics avoid excessive repetitive motion  Major depression mild recommend medication we talked about everything he is getting do counseling is also going to do medication he is going to follow-up in 4 weeks he is got let us know if he has any increased troubles or backsliding issues.  Patient denies abusing alcohol or smoking.

## 2018-02-22 ENCOUNTER — Ambulatory Visit: Payer: PRIVATE HEALTH INSURANCE | Admitting: Family Medicine

## 2018-02-22 ENCOUNTER — Encounter: Payer: Self-pay | Admitting: Family Medicine

## 2018-02-22 VITALS — BP 122/76 | Ht 67.0 in | Wt 147.8 lb

## 2018-02-22 DIAGNOSIS — Z79899 Other long term (current) drug therapy: Secondary | ICD-10-CM | POA: Diagnosis not present

## 2018-02-22 DIAGNOSIS — R945 Abnormal results of liver function studies: Secondary | ICD-10-CM | POA: Diagnosis not present

## 2018-02-22 DIAGNOSIS — F324 Major depressive disorder, single episode, in partial remission: Secondary | ICD-10-CM | POA: Diagnosis not present

## 2018-02-22 DIAGNOSIS — R7989 Other specified abnormal findings of blood chemistry: Secondary | ICD-10-CM

## 2018-02-22 DIAGNOSIS — E7849 Other hyperlipidemia: Secondary | ICD-10-CM | POA: Diagnosis not present

## 2018-02-22 MED ORDER — SERTRALINE HCL 50 MG PO TABS: 50.0000 mg | ORAL_TABLET | Freq: Every day | ORAL | 3 refills | Status: DC

## 2018-02-22 NOTE — Progress Notes (Signed)
   Subjective:    Patient ID: Rylie Knierim, male    DOB: June 04, 1971, 47 y.o.   MRN: [id number]  HPI Patient arrives for a one month follow up after starting Zoloft for depression. Patient states he is doing a lot better with the medication. Depression is been going on for multiple weeks actually doing better with the medication not suicidal tolerating the medicine well  Small cut to the finger that he bandaged himself he does not feel he needs stitches and he is up-to-date on tetanus shot  Review of Systems Please see above denies chest tightness pressure pain shortness breath nausea vomiting diarrhea fever chills sweats    Objective:   Physical Exam  Time was spent with the patient discussing his depression greater than half the time was spent in counseling 15 minutes was spent with patient today discussing healthcare issues which they came.  More than 50% of this visit-total duration of visit-was spent in counseling and coordination of care.  Please see diagnosis regarding the focus of this coordination and care      Assessment & Plan:  Depression improving continue medication rationale discussed patient not suicidal patient agrees medication patient will do follow-up regarding chronic lab issues in the fall with follow-up office visit if doing well at that time will taper off of the Zoloft  Patient has learned through counseling to avoid certain topics this is helped him significantly  Patient has a history of elevated cholesterol and slight elevation of liver function he is walking working on diet he does agreed to rechecking his lab work

## 2018-04-26 ENCOUNTER — Other Ambulatory Visit: Payer: Self-pay | Admitting: Family Medicine

## 2018-05-25 ENCOUNTER — Other Ambulatory Visit: Payer: Self-pay | Admitting: Family Medicine

## 2018-05-25 ENCOUNTER — Encounter: Payer: Self-pay | Admitting: Family Medicine

## 2018-05-25 ENCOUNTER — Ambulatory Visit: Payer: PRIVATE HEALTH INSURANCE | Admitting: Family Medicine

## 2018-05-25 VITALS — BP 118/74 | Ht 67.0 in | Wt 150.0 lb

## 2018-05-25 DIAGNOSIS — F324 Major depressive disorder, single episode, in partial remission: Secondary | ICD-10-CM

## 2018-05-25 DIAGNOSIS — Z79899 Other long term (current) drug therapy: Secondary | ICD-10-CM

## 2018-05-25 DIAGNOSIS — Z0000 Encounter for general adult medical examination without abnormal findings: Secondary | ICD-10-CM

## 2018-05-25 DIAGNOSIS — E7849 Other hyperlipidemia: Secondary | ICD-10-CM

## 2018-05-25 MED ORDER — SERTRALINE HCL 50 MG PO TABS: 50.0000 mg | ORAL_TABLET | Freq: Every day | ORAL | 2 refills | Status: DC

## 2018-05-25 NOTE — Progress Notes (Signed)
   Subjective:    Patient ID: Jim Hamilton, male    DOB: 1971-01-01, 47 y.o.   MRN: [id number]  Depression         Chronicity: follow up.  Associated symptoms include no fatigue and no headaches.  Treatments tried: zoloft.  Pt states no concerns today. No issues with med. Declines flu vaccine.  The patient states that overall the medication seems to be doing very well Denies being blue or sad States that helps him with anxiousness Helps him focus He does not want to stop the medication He does take the acid blocker intermittently for reflux Denies any major issues with that   Review of Systems  Constitutional: Negative for activity change, fatigue and fever.  HENT: Negative for congestion and rhinorrhea.   Respiratory: Negative for cough and shortness of breath.   Cardiovascular: Negative for chest pain and leg swelling.  Gastrointestinal: Negative for abdominal pain, diarrhea and nausea.  Genitourinary: Negative for dysuria and hematuria.  Neurological: Negative for weakness and headaches.  Psychiatric/Behavioral: Positive for depression. Negative for agitation and behavioral problems.       Objective:   Physical Exam  Constitutional: He appears well-nourished. No distress.  HENT:  Head: Normocephalic and atraumatic.  Eyes: Right eye exhibits no discharge. Left eye exhibits no discharge.  Neck: No tracheal deviation present.  Cardiovascular: Normal rate, regular rhythm and normal heart sounds.  No murmur heard. Pulmonary/Chest: Effort normal and breath sounds normal. No respiratory distress.  Musculoskeletal: He exhibits no edema.  Lymphadenopathy:    He has no cervical adenopathy.  Neurological: He is alert. Coordination normal.  Skin: Skin is warm and dry.  Psychiatric: He has a normal mood and affect. His behavior is normal.  Vitals reviewed.   15 minutes was spent with patient today discussing healthcare issues which they came.  More than 50% of this visit-total  duration of visit-was spent in counseling and coordination of care.  Please see diagnosis regarding the focus of this coordination and care       Assessment & Plan:  Patient doing very well We did discuss positive approaches towards stress Continue medication well into the springtime then re-decide at that point in time whether or not to stop the medicine If any setbacks to let us know  Recheck in the spring  Wellness at his convenience   Reflux use Protonix as needed

## 2018-07-07 LAB — BASIC METABOLIC PANEL
BUN / CREAT RATIO: 13 (ref 9–20)
BUN: 14 mg/dL (ref 6–24)
CALCIUM: 10.1 mg/dL (ref 8.7–10.2)
CO2: 23 mmol/L (ref 20–29)
CREATININE: 1.06 mg/dL (ref 0.76–1.27)
Chloride: 100 mmol/L (ref 96–106)
GFR, EST AFRICAN AMERICAN: 97 mL/min/{1.73_m2} (ref >59)
GFR, EST NON AFRICAN AMERICAN: 84 mL/min/{1.73_m2} (ref >59)
Glucose: 91 mg/dL (ref 65–99)
Potassium: 5.2 mmol/L (ref 3.5–5.2)
Sodium: 140 mmol/L (ref 134–144)

## 2018-07-07 LAB — LIPID PANEL
CHOLESTEROL TOTAL: 277 mg/dL — AB (ref 100–199)
Chol/HDL Ratio: 6.3 ratio — ABNORMAL HIGH (ref 0.0–5.0)
HDL: 44 mg/dL (ref >39)
LDL Calculated: 206 mg/dL — ABNORMAL HIGH (ref 0–99)
Triglycerides: 135 mg/dL (ref 0–149)
VLDL Cholesterol Cal: 27 mg/dL (ref 5–40)

## 2018-07-07 LAB — HEPATIC FUNCTION PANEL
ALK PHOS: 82 IU/L (ref 39–117)
ALT: 55 IU/L — AB (ref 0–44)
AST: 35 IU/L (ref 0–40)
Albumin: 4.8 g/dL (ref 3.5–5.5)
BILIRUBIN, DIRECT: 0.1 mg/dL (ref 0.00–0.40)
Bilirubin Total: 0.4 mg/dL (ref 0.0–1.2)
Total Protein: 7.5 g/dL (ref 6.0–8.5)

## 2018-07-14 ENCOUNTER — Ambulatory Visit (INDEPENDENT_AMBULATORY_CARE_PROVIDER_SITE_OTHER): Payer: PRIVATE HEALTH INSURANCE | Admitting: Family Medicine

## 2018-07-14 ENCOUNTER — Encounter: Payer: Self-pay | Admitting: Family Medicine

## 2018-07-14 VITALS — BP 120/86 | Ht 66.5 in | Wt 155.4 lb

## 2018-07-14 DIAGNOSIS — Z0000 Encounter for general adult medical examination without abnormal findings: Secondary | ICD-10-CM

## 2018-07-14 DIAGNOSIS — R7989 Other specified abnormal findings of blood chemistry: Secondary | ICD-10-CM

## 2018-07-14 DIAGNOSIS — E785 Hyperlipidemia, unspecified: Secondary | ICD-10-CM | POA: Insufficient documentation

## 2018-07-14 DIAGNOSIS — R945 Abnormal results of liver function studies: Secondary | ICD-10-CM | POA: Diagnosis not present

## 2018-07-14 MED ORDER — ROSUVASTATIN CALCIUM 10 MG PO TABS: 10.0000 mg | ORAL_TABLET | Freq: Every day | ORAL | 1 refills | Status: DC

## 2018-07-14 MED ORDER — SERTRALINE HCL 50 MG PO TABS: 50.0000 mg | ORAL_TABLET | Freq: Every day | ORAL | 2 refills | Status: DC

## 2018-07-14 NOTE — Progress Notes (Signed)
   Subjective:    Patient ID: Jim Hamilton, male    DOB: [birth year], 47 y.o.   MRN: [id number]  HPI The patient comes in today for a wellness visit. Patient under some stress but handles it well Not depressed Tolerating medicine well More than likely will stop the medication in the spring time Exercising some watching diet well Lab work work was reviewed including excessively elevated cholesterol   A review of their health history was completed.  A review of medications was also completed.  Any needed refills; none at this time but refills may be wrong on bottle  Eating habits: health conscious   Falls/  MVA accidents in past few months: none  Regular exercise: no  Specialist pt sees on regular basis: no  Preventative health issues were discussed.   Additional concerns: none   Review of Systems  Constitutional: Negative for diaphoresis and fatigue.  HENT: Negative for congestion and rhinorrhea.   Respiratory: Negative for cough and shortness of breath.   Cardiovascular: Negative for chest pain and leg swelling.  Gastrointestinal: Negative for abdominal pain and diarrhea.  Skin: Negative for color change and rash.  Neurological: Negative for dizziness and headaches.  Psychiatric/Behavioral: Negative for behavioral problems and confusion.       Objective:   Physical Exam  Constitutional: He appears well-nourished. No distress.  HENT:  Head: Normocephalic and atraumatic.  Eyes: Right eye exhibits no discharge. Left eye exhibits no discharge.  Neck: No tracheal deviation present.  Cardiovascular: Normal rate, regular rhythm and normal heart sounds.  No murmur heard. Pulmonary/Chest: Effort normal and breath sounds normal. No respiratory distress.  Musculoskeletal: He exhibits no edema.  Lymphadenopathy:    He has no cervical adenopathy.  Neurological: He is alert. Coordination normal.  Skin: Skin is warm and dry.  Psychiatric: He has a normal mood and affect. His  behavior is normal.  Vitals reviewed.         Assessment & Plan:  Hyperlipidemia Increased risk of heart disease dad died age 91 Start statin Side effects discussed Follow-up in approximately 8 to 12 weeks with lab work and a follow-up office visit by springtime  Depression good control continue current measures  Adult wellness-complete.wellness physical was conducted today. Importance of diet and exercise were discussed in detail.  In addition to this a discussion regarding safety was also covered. We also reviewed over immunizations and gave recommendations regarding current immunization needed for age.  In addition to this additional areas were also touched on including: Preventative health exams needed:  Colonoscopy not indicated currently  Patient was advised yearly wellness exam

## 2018-07-14 NOTE — Patient Instructions (Signed)
Cholesterol Cholesterol is a fat. Your body needs a small amount of cholesterol. Cholesterol (plaque) may build up in your blood vessels (arteries). That makes you more likely to have a heart attack or stroke. You cannot feel your cholesterol level. Having a blood test is the only way to find out if your level is high. Keep your test results. Work with your doctor to keep your cholesterol at a good level. What do the results mean?  Total cholesterol is how much cholesterol is in your blood.  LDL is bad cholesterol. This is the type that can build up. Try to have low LDL.  HDL is good cholesterol. It cleans your blood vessels and carries LDL away. Try to have high HDL.  Triglycerides are fat that the body can store or burn for energy. What are good levels of cholesterol?  Total cholesterol below 200.  LDL below 100 is good for people who have health risks. LDL below 70 is good for people who have very high risks.  HDL above 40 is good. It is best to have HDL of 60 or higher.  Triglycerides below 150. How can I lower my cholesterol? Diet Follow your diet program as told by your doctor.  Choose fish, white meat chicken, or turkey that is roasted or baked. Try not to eat red meat, fried foods, sausage, or lunch meats.  Eat lots of fresh fruits and vegetables.  Choose whole grains, beans, pasta, potatoes, and cereals.  Choose olive oil, corn oil, or canola oil. Only use small amounts.  Try not to eat butter, mayonnaise, shortening, or palm kernel oils.  Try not to eat foods with trans fats.  Choose low-fat or nonfat dairy foods. ? Drink skim or nonfat milk. ? Eat low-fat or nonfat yogurt and cheeses. ? Try not to drink whole milk or cream. ? Try not to eat ice cream, egg yolks, or full-fat cheeses.  Healthy desserts include angel food cake, ginger snaps, animal crackers, hard candy, popsicles, and low-fat or nonfat frozen yogurt. Try not to eat pastries, cakes, pies, and  cookies.  Exercise Follow your exercise program as told by your doctor.  Be more active. Try gardening, walking, and taking the stairs.  Ask your doctor about ways that you can be more active.  Medicine  Take over-the-counter and prescription medicines only as told by your doctor. This information is not intended to replace advice given to you by your health care provider. Make sure you discuss any questions you have with your health care provider. Document Released: 11/07/2008 Document Revised: 03/12/2016 Document Reviewed: 02/21/2016 Elsevier Interactive Patient Education  2018 [employer]  

## 2018-10-19 ENCOUNTER — Encounter: Payer: Self-pay | Admitting: Family Medicine

## 2018-10-19 ENCOUNTER — Ambulatory Visit: Payer: PRIVATE HEALTH INSURANCE | Admitting: Family Medicine

## 2018-10-19 VITALS — BP 122/76 | Temp 97.9°F | Ht 66.5 in | Wt 164.4 lb

## 2018-10-19 DIAGNOSIS — J329 Chronic sinusitis, unspecified: Secondary | ICD-10-CM

## 2018-10-19 MED ORDER — CEFPROZIL 500 MG PO TABS: 500.0000 mg | ORAL_TABLET | Freq: Two times a day (BID) | ORAL | 0 refills | Status: DC

## 2018-10-19 NOTE — Progress Notes (Signed)
   Subjective:    Patient ID: Jim Hamilton, male    DOB: May 02, 1971, 48 y.o.   MRN: [id number]  Otalgia   There is pain in both ears. The current episode started in the past 7 days. Associated symptoms comments: congestion. He has tried nothing for the symptoms.   No major exposures   Some exposure to the public   Ears started huriting late  Sun eve  Felt clogged  Got an unsteady feeling  Flu in the family    Feels sore in the chest   No fever   Some nasal cong  nasa lspray   No sig cough    ocas er plusg s    Review of Systems  HENT: Positive for ear pain.        Objective:   Physical Exam Alert, mild malaise. Hydration good Vitals stable. frontal/ maxillary tenderness evident positive nasal congestion. pharynx normal neck supple  lungs clear/no crackles or wheezes. heart regular in rhythm        Assessment & Plan:  Impression rhinosinusitis likely post viral, discussed with patient. plan antibiotics prescribed. Questions answered. Symptomatic care discussed. warning signs discussed. WSL

## 2019-03-24 ENCOUNTER — Other Ambulatory Visit: Payer: Self-pay

## 2019-03-24 DIAGNOSIS — Z20822 Contact with and (suspected) exposure to covid-19: Secondary | ICD-10-CM

## 2019-03-26 LAB — NOVEL CORONAVIRUS, NAA: SARS-CoV-2, NAA: NOT DETECTED

## 2019-03-30 ENCOUNTER — Telehealth: Payer: Self-pay | Admitting: Family Medicine

## 2019-03-30 NOTE — Telephone Encounter (Signed)
Pt returning call for covid results; negative. Pt verbalizes understanding. Assisted with MyChart set up.

## 2019-04-01 NOTE — Telephone Encounter (Signed)
Mailed COVID results to pt's home address, per his request.

## 2019-06-08 ENCOUNTER — Other Ambulatory Visit: Payer: Self-pay | Admitting: *Deleted

## 2019-06-08 DIAGNOSIS — Z20822 Contact with and (suspected) exposure to covid-19: Secondary | ICD-10-CM

## 2019-06-10 LAB — NOVEL CORONAVIRUS, NAA: SARS-CoV-2, NAA: NOT DETECTED

## 2019-06-21 ENCOUNTER — Telehealth: Payer: Self-pay | Admitting: Family Medicine

## 2019-06-21 DIAGNOSIS — E785 Hyperlipidemia, unspecified: Secondary | ICD-10-CM

## 2019-06-21 DIAGNOSIS — Z79899 Other long term (current) drug therapy: Secondary | ICD-10-CM

## 2019-06-21 NOTE — Telephone Encounter (Signed)
Blood work ordered in Epic. Patient notified. 

## 2019-06-21 NOTE — Telephone Encounter (Signed)
Last las 06/2018: Lipid, Liver and Met 7

## 2019-06-21 NOTE — Telephone Encounter (Signed)
Patient needing labs for physical on 11/30

## 2019-06-21 NOTE — Telephone Encounter (Signed)
Lipid, liver, metabolic 7 

## 2019-06-29 LAB — BASIC METABOLIC PANEL
BUN/Creatinine Ratio: 11 (ref 9–20)
BUN: 12 mg/dL (ref 6–24)
CO2: 25 mmol/L (ref 20–29)
Calcium: 10.1 mg/dL (ref 8.7–10.2)
Chloride: 104 mmol/L (ref 96–106)
Creatinine, Ser: 1.05 mg/dL (ref 0.76–1.27)
GFR calc Af Amer: 97 mL/min/{1.73_m2} (ref >59)
GFR calc non Af Amer: 84 mL/min/{1.73_m2} (ref >59)
Glucose: 87 mg/dL (ref 65–99)
Potassium: 4.8 mmol/L (ref 3.5–5.2)
Sodium: 142 mmol/L (ref 134–144)

## 2019-06-29 LAB — LIPID PANEL
Chol/HDL Ratio: 6.8 ratio — ABNORMAL HIGH (ref 0.0–5.0)
Cholesterol, Total: 239 mg/dL — ABNORMAL HIGH (ref 100–199)
HDL: 35 mg/dL — ABNORMAL LOW (ref >39)
LDL Chol Calc (NIH): 166 mg/dL — ABNORMAL HIGH (ref 0–99)
Triglycerides: 204 mg/dL — ABNORMAL HIGH (ref 0–149)
VLDL Cholesterol Cal: 38 mg/dL (ref 5–40)

## 2019-06-29 LAB — HEPATIC FUNCTION PANEL
ALT: 50 IU/L — ABNORMAL HIGH (ref 0–44)
AST: 34 IU/L (ref 0–40)
Albumin: 4.7 g/dL (ref 4.0–5.0)
Alkaline Phosphatase: 91 IU/L (ref 39–117)
Bilirubin Total: 0.6 mg/dL (ref 0.0–1.2)
Bilirubin, Direct: 0.13 mg/dL (ref 0.00–0.40)
Total Protein: 7 g/dL (ref 6.0–8.5)

## 2019-07-25 ENCOUNTER — Other Ambulatory Visit: Payer: Self-pay

## 2019-07-25 ENCOUNTER — Encounter: Payer: Self-pay | Admitting: Family Medicine

## 2019-07-25 ENCOUNTER — Ambulatory Visit (INDEPENDENT_AMBULATORY_CARE_PROVIDER_SITE_OTHER): Payer: PRIVATE HEALTH INSURANCE | Admitting: Family Medicine

## 2019-07-25 VITALS — BP 122/74 | Temp 96.8°F | Ht 66.5 in | Wt 155.8 lb

## 2019-07-25 DIAGNOSIS — Z0000 Encounter for general adult medical examination without abnormal findings: Secondary | ICD-10-CM

## 2019-07-25 DIAGNOSIS — E785 Hyperlipidemia, unspecified: Secondary | ICD-10-CM | POA: Diagnosis not present

## 2019-07-25 DIAGNOSIS — R7989 Other specified abnormal findings of blood chemistry: Secondary | ICD-10-CM | POA: Diagnosis not present

## 2019-07-25 MED ORDER — PANTOPRAZOLE SODIUM 40 MG PO TBEC: 40.0000 mg | DELAYED_RELEASE_TABLET | Freq: Every day | ORAL | 1 refills | Status: DC

## 2019-07-25 MED ORDER — ROSUVASTATIN CALCIUM 10 MG PO TABS: 10.0000 mg | ORAL_TABLET | Freq: Every day | ORAL | 1 refills | Status: DC

## 2019-07-25 MED ORDER — SERTRALINE HCL 50 MG PO TABS: 50.0000 mg | ORAL_TABLET | Freq: Every day | ORAL | 1 refills | Status: DC

## 2019-07-25 NOTE — Progress Notes (Signed)
   Subjective:    Patient ID: Jim Hamilton, male    DOB: 06/03/1971, 48 y.o.   MRN: [id number]  HPI The patient comes in today for a wellness visit.    A review of their health history was completed.  A review of medications was also completed.  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   Additional concerns:    Review of Systems  Constitutional: Negative for diaphoresis and fatigue.  HENT: Negative for congestion and rhinorrhea.   Respiratory: Negative for cough and shortness of breath.   Cardiovascular: Negative for chest pain and leg swelling.  Gastrointestinal: Negative for abdominal pain and diarrhea.  Skin: Negative for color change and rash.  Neurological: Negative for dizziness and headaches.  Psychiatric/Behavioral: Negative for behavioral problems and confusion.       Objective:   Physical Exam Vitals signs reviewed.  Constitutional:      General: He is not in acute distress. HENT:     Head: Normocephalic and atraumatic.  Eyes:     General:        Right eye: No discharge.        Left eye: No discharge.  Neck:     Trachea: No tracheal deviation.  Cardiovascular:     Rate and Rhythm: Normal rate and regular rhythm.     Heart sounds: Normal heart sounds. No murmur.  Pulmonary:     Effort: Pulmonary effort is normal. No respiratory distress.     Breath sounds: Normal breath sounds.  Lymphadenopathy:     Cervical: No cervical adenopathy.  Skin:    General: Skin is warm and dry.  Neurological:     Mental Status: He is alert.     Coordination: Coordination normal.  Psychiatric:        Behavior: Behavior normal.      Prostate not indicated     Assessment & Plan:  Adult  wellness-complete.wellness physical was conducted today. Importance of diet and exercise were discussed in detail.  In addition to this a discussion regarding safety was also covered. We also reviewed over immunizations and gave recommendations regarding current immunization needed for age.  In addition to this additional areas were also touched on including: Preventative health exams needed:  Colonoscopy not at age yet  Patient was advised yearly wellness exam  Hyperlipidemia important for the patient to be on cholesterol medicine the lower his risk has strong family history and LDL above 160  Moods doing well with Zoloft refills were given  Follow-up in 6 months

## 2020-01-13 LAB — HEPATIC FUNCTION PANEL
ALT: 53 IU/L — ABNORMAL HIGH (ref 0–44)
AST: 49 IU/L — ABNORMAL HIGH (ref 0–40)
Albumin: 5.2 g/dL — ABNORMAL HIGH (ref 4.0–5.0)
Alkaline Phosphatase: 92 IU/L (ref 48–121)
Bilirubin Total: 0.6 mg/dL (ref 0.0–1.2)
Bilirubin, Direct: 0.18 mg/dL (ref 0.00–0.40)
Total Protein: 7.6 g/dL (ref 6.0–8.5)

## 2020-01-13 LAB — LIPID PANEL
Chol/HDL Ratio: 4 ratio (ref 0.0–5.0)
Cholesterol, Total: 162 mg/dL (ref 100–199)
HDL: 41 mg/dL (ref >39)
LDL Chol Calc (NIH): 91 mg/dL (ref 0–99)
Triglycerides: 172 mg/dL — ABNORMAL HIGH (ref 0–149)
VLDL Cholesterol Cal: 30 mg/dL (ref 5–40)

## 2020-01-16 ENCOUNTER — Other Ambulatory Visit: Payer: Self-pay

## 2020-01-16 ENCOUNTER — Encounter: Payer: Self-pay | Admitting: Family Medicine

## 2020-01-16 ENCOUNTER — Ambulatory Visit (INDEPENDENT_AMBULATORY_CARE_PROVIDER_SITE_OTHER): Payer: PRIVATE HEALTH INSURANCE | Admitting: Family Medicine

## 2020-01-16 VITALS — BP 116/68 | Temp 96.8°F | Wt 164.2 lb

## 2020-01-16 DIAGNOSIS — R7989 Other specified abnormal findings of blood chemistry: Secondary | ICD-10-CM | POA: Diagnosis not present

## 2020-01-16 DIAGNOSIS — E785 Hyperlipidemia, unspecified: Secondary | ICD-10-CM

## 2020-01-16 MED ORDER — PANTOPRAZOLE SODIUM 40 MG PO TBEC: 40.0000 mg | DELAYED_RELEASE_TABLET | Freq: Every day | ORAL | 1 refills | Status: DC

## 2020-01-16 MED ORDER — SERTRALINE HCL 50 MG PO TABS: 50.0000 mg | ORAL_TABLET | Freq: Every day | ORAL | 1 refills | Status: DC

## 2020-01-16 MED ORDER — ROSUVASTATIN CALCIUM 10 MG PO TABS: 10.0000 mg | ORAL_TABLET | Freq: Every day | ORAL | 1 refills | Status: DC

## 2020-01-16 NOTE — Progress Notes (Signed)
Reminder placed in reminder file  

## 2020-01-16 NOTE — Progress Notes (Signed)
   Subjective:    Patient ID: Jim Hamilton, male    DOB: Sep 12, 1970, 49 y.o.   MRN: [id number]  HPI Pt here today for follow up. Pt states he is not having any issues with medication. Taking Protonix, Crestor and Zoloft daily.  Patient states his moods are doing good but he feels he does better with taking Zoloft on a regular basis it helps keep his emotions even. Denies any dysphagia issues but does state when he stops Protonix he gets a lot of heartburn therefore he would like to continue this medication  He does try to watch fats in his diet Physically active he recently had blood work  Results for orders placed or performed in visit on 07/25/19  Lipid Profile  Result Value Ref Range   Cholesterol, Total 162 100 - 199 mg/dL   Triglycerides 172 (H) 0 - 149 mg/dL   HDL 41 >39 mg/dL   VLDL Cholesterol Cal 30 5 - 40 mg/dL   LDL Chol Calc (NIH) 91 0 - 99 mg/dL   Chol/HDL Ratio 4.0 0.0 - 5.0 ratio  Hepatic function panel  Result Value Ref Range   Total Protein 7.6 6.0 - 8.5 g/dL   Albumin 5.2 (H) 4.0 - 5.0 g/dL   Bilirubin Total 0.6 0.0 - 1.2 mg/dL   Bilirubin, Direct 0.18 0.00 - 0.40 mg/dL   Alkaline Phosphatase 92 48 - 121 IU/L   AST 49 (H) 0 - 40 IU/L   ALT 53 (H) 0 - 44 IU/L   Labs were discussed with patient Review of Systems  Constitutional: Negative for activity change, fatigue and fever.  HENT: Negative for congestion and rhinorrhea.   Respiratory: Negative for cough and shortness of breath.   Cardiovascular: Negative for chest pain and leg swelling.  Gastrointestinal: Negative for abdominal pain, diarrhea and nausea.  Genitourinary: Negative for dysuria and hematuria.  Neurological: Negative for weakness and headaches.  Psychiatric/Behavioral: Negative for agitation and behavioral problems.       Objective:   Physical Exam Vitals reviewed.  Constitutional:      General: He is not in acute distress. HENT:     Head: Normocephalic and atraumatic.  Eyes:   General:        Right eye: No discharge.        Left eye: No discharge.  Neck:     Trachea: No tracheal deviation.  Cardiovascular:     Rate and Rhythm: Normal rate and regular rhythm.     Heart sounds: Normal heart sounds. No murmur.  Pulmonary:     Effort: Pulmonary effort is normal. No respiratory distress.     Breath sounds: Normal breath sounds.  Lymphadenopathy:     Cervical: No cervical adenopathy.  Skin:    General: Skin is warm and dry.  Neurological:     Mental Status: He is alert.     Coordination: Coordination normal.  Psychiatric:        Behavior: Behavior normal.           Assessment & Plan:  Hyperlipidemia stable continue current medication Reflux stable continue current medication Emotional issues doing better with the Zoloft he would like to continue  refill of Zoloft given  Mild elevation of liver enzymes patient defers on ultrasound currently we will do ultrasound next spring with lab work Patient will do a follow-up wellness checkup later this year

## 2020-01-16 NOTE — Patient Instructions (Signed)
Results for orders placed or performed in visit on 07/25/19  Lipid Profile  Result Value Ref Range   Cholesterol, Total 162 100 - 199 mg/dL   Triglycerides 172 (H) 0 - 149 mg/dL   HDL 41 >39 mg/dL   VLDL Cholesterol Cal 30 5 - 40 mg/dL   LDL Chol Calc (NIH) 91 0 - 99 mg/dL   Chol/HDL Ratio 4.0 0.0 - 5.0 ratio  Hepatic function panel  Result Value Ref Range   Total Protein 7.6 6.0 - 8.5 g/dL   Albumin 5.2 (H) 4.0 - 5.0 g/dL   Bilirubin Total 0.6 0.0 - 1.2 mg/dL   Bilirubin, Direct 0.18 0.00 - 0.40 mg/dL   Alkaline Phosphatase 92 48 - 121 IU/L   AST 49 (H) 0 - 40 IU/L   ALT 53 (H) 0 - 44 IU/L

## 2020-02-15 ENCOUNTER — Other Ambulatory Visit: Payer: Self-pay | Admitting: Family Medicine

## 2020-05-29 ENCOUNTER — Other Ambulatory Visit: Payer: Self-pay | Admitting: Family Medicine

## 2020-07-27 ENCOUNTER — Telehealth (INDEPENDENT_AMBULATORY_CARE_PROVIDER_SITE_OTHER): Payer: PRIVATE HEALTH INSURANCE | Admitting: Family Medicine

## 2020-07-27 ENCOUNTER — Other Ambulatory Visit: Payer: Self-pay

## 2020-07-27 DIAGNOSIS — B9689 Other specified bacterial agents as the cause of diseases classified elsewhere: Secondary | ICD-10-CM | POA: Diagnosis not present

## 2020-07-27 DIAGNOSIS — J019 Acute sinusitis, unspecified: Secondary | ICD-10-CM

## 2020-07-27 MED ORDER — AMOXICILLIN-POT CLAVULANATE 875-125 MG PO TABS: 1.0000 | ORAL_TABLET | Freq: Two times a day (BID) | ORAL | 0 refills | Status: DC

## 2020-07-27 NOTE — Progress Notes (Signed)
Patient ID: Jim Hamilton, male    DOB: 06/03/1971, 49 y.o.   MRN: [id number]   Chief Complaint  Patient presents with  . Sinusitis   Subjective:  CC: sore throat, stopped up nose with pressure ears and under eyes  This is a new problem.  Connected via telephone, identified with 2 patient identifiers.  Complains of sore throat, stuffed up nose, cough and now with pressure in his ears and under his eyes.  Symptoms started last Thursday or Friday.  No he feels like he has lots of chest congestion.  Has a history of a severe sinus infection in the past.  Has tried Flonase, and Mucinex which has helped some.  His head feels "swimmy "headed.  Denies fever, chills, chest pain, shortness of breath.  Sinusitis This is a new problem. Episode onset: Last friday. There has been no fever. Associated symptoms include congestion, coughing, ear pain, sinus pressure and a sore throat. Pertinent negatives include no chills or shortness of breath. Treatments tried: mucinex, flonase.    Virtual Visit via Video Note  I connected with Jim Hamilton on 07/27/20 at  1:30 PM EST by a telephone enabled telemedicine application and verified that I am speaking with the correct person using two identifiers.  Location: Patient: home Provider: office   I discussed the limitations of evaluation and management by telemedicine and the availability of in person appointments. The patient expressed understanding and agreed to proceed.  History of Present Illness:    Observations/Objective:   Assessment and Plan:   Follow Up Instructions:    I discussed the assessment and treatment plan with the patient. The patient was provided an opportunity to ask questions and all were answered. The patient agreed with the plan and demonstrated an understanding of the instructions.   The patient was advised to call back or seek an in-person evaluation if the symptoms worsen or if the condition fails to improve as  anticipated.  I provided 6 minutes of non-face-to-face time during this encounter.   Medical History Jim Hamilton has a past medical history of Hypercholesterolemia, MRSA infection, and Scrotal abscess.   Outpatient Encounter Medications as of 07/27/2020  Medication Sig  . amoxicillin-clavulanate (AUGMENTIN) 875-125 MG tablet Take 1 tablet by mouth 2 (two) times daily.  . pantoprazole (PROTONIX) 40 MG tablet Take 1 tablet (40 mg total) by mouth daily.  . rosuvastatin (CRESTOR) 10 MG tablet TAKE 1 TABLET BY MOUTH ONCE DAILY.  Marland Kitchen sertraline (ZOLOFT) 50 MG tablet Take 1 tablet (50 mg total) by mouth daily.   No facility-administered encounter medications on file as of 07/27/2020.     Review of Systems  Constitutional: Negative for chills and fever.  HENT: Positive for congestion, ear pain, sinus pressure, sinus pain and sore throat.   Respiratory: Positive for cough. Negative for shortness of breath.   Cardiovascular: Negative for chest pain.     Vitals There were no vitals taken for this visit. unable Objective:   Physical Exam  unable Assessment and Plan   1. Acute bacterial rhinosinusitis - amoxicillin-clavulanate (AUGMENTIN) 875-125 MG tablet; Take 1 tablet by mouth 2 (two) times daily.  Dispense: 20 tablet; Refill: 0   Will treat for acute bacterial rhinosinusitis.  He understands to take the antibiotic twice per day for 10 days.  Agrees with plan of care discussed today. Understands warning signs to seek further care: Chest pain, shortness of breath, any significant change in health. Understands to follow-up if symptoms do not improve, or worsen.  He declines needing any other symptomatic treatment, he will continue to use his OTC medications.  Pecolia Ades, FNP-C

## 2020-07-29 ENCOUNTER — Encounter: Payer: Self-pay | Admitting: Family Medicine

## 2020-08-09 ENCOUNTER — Other Ambulatory Visit: Payer: Self-pay

## 2020-08-09 ENCOUNTER — Ambulatory Visit (INDEPENDENT_AMBULATORY_CARE_PROVIDER_SITE_OTHER): Payer: PRIVATE HEALTH INSURANCE | Admitting: Family Medicine

## 2020-08-09 ENCOUNTER — Encounter: Payer: Self-pay | Admitting: Family Medicine

## 2020-08-09 VITALS — BP 110/74 | HR 89 | Temp 97.4°F | Ht 68.0 in | Wt 166.0 lb

## 2020-08-09 DIAGNOSIS — Z79899 Other long term (current) drug therapy: Secondary | ICD-10-CM | POA: Diagnosis not present

## 2020-08-09 DIAGNOSIS — E785 Hyperlipidemia, unspecified: Secondary | ICD-10-CM

## 2020-08-09 DIAGNOSIS — Z0000 Encounter for general adult medical examination without abnormal findings: Secondary | ICD-10-CM

## 2020-08-09 MED ORDER — PANTOPRAZOLE SODIUM 40 MG PO TBEC: 40.0000 mg | DELAYED_RELEASE_TABLET | Freq: Every day | ORAL | 1 refills | Status: DC

## 2020-08-09 MED ORDER — ROSUVASTATIN CALCIUM 10 MG PO TABS: 10.0000 mg | ORAL_TABLET | Freq: Every day | ORAL | 1 refills | Status: DC

## 2020-08-09 MED ORDER — SERTRALINE HCL 50 MG PO TABS: 50.0000 mg | ORAL_TABLET | Freq: Every day | ORAL | 1 refills | Status: DC

## 2020-08-09 MED ORDER — DOXYCYCLINE HYCLATE 100 MG PO TABS: 100.0000 mg | ORAL_TABLET | Freq: Two times a day (BID) | ORAL | 0 refills | Status: DC

## 2020-08-09 NOTE — Patient Instructions (Signed)

## 2020-08-09 NOTE — Progress Notes (Signed)
Subjective:    Patient ID: Jim Hamilton, male    DOB: Jul 17, 1971, 49 y.o.   MRN: [id number]  HPI The patient comes in today for a wellness visit.    A review of their health history was completed.  A review of medications was also completed.  Any needed refills; would like 90 day on all meds  Eating habits:  Health conscious  Falls/  MVA accidents in past few months: none  Regular exercise: none  Specialist pt sees on regular basis: none  Preventative health issues were discussed.   Additional concerns: none    Review of Systems  Constitutional: Negative for activity change, appetite change and fever.  HENT: Negative for congestion and rhinorrhea.   Eyes: Negative for discharge.  Respiratory: Negative for cough and wheezing.   Cardiovascular: Negative for chest pain.  Gastrointestinal: Negative for abdominal pain, blood in stool and vomiting.  Genitourinary: Negative for difficulty urinating and frequency.  Musculoskeletal: Negative for neck pain.  Skin: Negative for rash.  Allergic/Immunologic: Negative for environmental allergies and food allergies.  Neurological: Negative for weakness and headaches.  Psychiatric/Behavioral: Negative for agitation.       Objective:   Physical Exam Constitutional:      Appearance: He is well-developed and well-nourished.  HENT:     Head: Normocephalic and atraumatic.     Right Ear: External ear normal.     Left Ear: External ear normal.     Nose: Nose normal.     Mouth/Throat:     Mouth: Oropharynx is clear and moist.  Eyes:     Extraocular Movements: EOM normal.     Pupils: Pupils are equal, round, and reactive to light.  Neck:     Thyroid: No thyromegaly.  Cardiovascular:     Rate and Rhythm: Normal rate and regular rhythm.     Heart sounds: Normal heart sounds. No murmur heard.   Pulmonary:     Effort: Pulmonary effort is normal. No respiratory distress.     Breath sounds: Normal breath sounds. No wheezing.   Abdominal:     General: Bowel sounds are normal. There is no distension.     Palpations: Abdomen is soft. There is no mass.     Tenderness: There is no abdominal tenderness.  Genitourinary:    Penis: Normal.   Musculoskeletal:        General: No edema. Normal range of motion.     Cervical back: Normal range of motion and neck supple.  Lymphadenopathy:     Cervical: No cervical adenopathy.  Skin:    General: Skin is warm and dry.     Findings: No erythema.  Neurological:     Mental Status: He is alert.     Motor: No abnormal muscle tone.  Psychiatric:        Mood and Affect: Mood and affect normal.        Behavior: Behavior normal.        Judgment: Judgment normal.    Testicular exam normal prostate not indicated  No family history of colon cancer or prostate cancer     Assessment & Plan:  Adult wellness-complete.wellness physical was conducted today. Importance of diet and exercise were discussed in detail.  In addition to this a discussion regarding safety was also covered. We also reviewed over immunizations and gave recommendations regarding current immunization needed for age.  In addition to this additional areas were also touched on including: Preventative health exams needed:  Colonoscopy although indications now  for age 35 many insurance companies will not cover until age 50 I have encouraged the patient that he can check base with his insurance company to see if they cover colonoscopy currently otherwise wait till age 55  Patient was advised yearly wellness exam Labs were ordered await the results

## 2020-10-23 ENCOUNTER — Other Ambulatory Visit: Payer: Self-pay | Admitting: *Deleted

## 2020-10-23 DIAGNOSIS — K760 Fatty (change of) liver, not elsewhere classified: Secondary | ICD-10-CM

## 2020-10-31 ENCOUNTER — Other Ambulatory Visit: Payer: Self-pay

## 2020-10-31 ENCOUNTER — Ambulatory Visit (HOSPITAL_COMMUNITY)
Admission: RE | Admit: 2020-10-31 | Discharge: 2020-10-31 | Disposition: A | Discharge status: Home / Self Care | Payer: PRIVATE HEALTH INSURANCE | Source: Ambulatory Visit | Attending: Family Medicine | Admitting: Family Medicine

## 2020-10-31 DIAGNOSIS — K760 Fatty (change of) liver, not elsewhere classified: Secondary | ICD-10-CM | POA: Diagnosis not present

## 2020-11-01 ENCOUNTER — Other Ambulatory Visit: Payer: Self-pay | Admitting: *Deleted

## 2020-11-01 DIAGNOSIS — E875 Hyperkalemia: Secondary | ICD-10-CM

## 2020-11-01 LAB — HEPATIC FUNCTION PANEL
ALT: 42 IU/L (ref 0–44)
AST: 36 IU/L (ref 0–40)
Albumin: 4.9 g/dL (ref 4.0–5.0)
Alkaline Phosphatase: 87 IU/L (ref 44–121)
Bilirubin Total: 0.6 mg/dL (ref 0.0–1.2)
Bilirubin, Direct: 0.13 mg/dL (ref 0.00–0.40)
Total Protein: 7.4 g/dL (ref 6.0–8.5)

## 2020-11-01 LAB — BASIC METABOLIC PANEL
BUN/Creatinine Ratio: 11 (ref 9–20)
BUN: 13 mg/dL (ref 6–24)
CO2: 24 mmol/L (ref 20–29)
Calcium: 10.3 mg/dL — ABNORMAL HIGH (ref 8.7–10.2)
Chloride: 103 mmol/L (ref 96–106)
Creatinine, Ser: 1.16 mg/dL (ref 0.76–1.27)
Glucose: 90 mg/dL (ref 65–99)
Potassium: 5.9 mmol/L — ABNORMAL HIGH (ref 3.5–5.2)
Sodium: 143 mmol/L (ref 134–144)
eGFR: 77 mL/min/{1.73_m2} (ref >59)

## 2020-11-01 LAB — LIPID PANEL
Chol/HDL Ratio: 4.3 ratio (ref 0.0–5.0)
Cholesterol, Total: 193 mg/dL (ref 100–199)
HDL: 45 mg/dL (ref >39)
LDL Chol Calc (NIH): 115 mg/dL — ABNORMAL HIGH (ref 0–99)
Triglycerides: 189 mg/dL — ABNORMAL HIGH (ref 0–149)
VLDL Cholesterol Cal: 33 mg/dL (ref 5–40)

## 2020-12-12 ENCOUNTER — Other Ambulatory Visit: Payer: Self-pay | Admitting: Family Medicine

## 2020-12-12 NOTE — Telephone Encounter (Signed)
90 day of each and follow up ov by summer

## 2021-07-03 ENCOUNTER — Ambulatory Visit
Admission: EM | Admit: 2021-07-03 | Discharge: 2021-07-03 | Disposition: A | Discharge status: Home / Self Care | Payer: No Typology Code available for payment source | Attending: Family Medicine | Admitting: Family Medicine

## 2021-07-03 ENCOUNTER — Other Ambulatory Visit: Payer: Self-pay

## 2021-07-03 ENCOUNTER — Encounter: Payer: Self-pay | Admitting: Emergency Medicine

## 2021-07-03 DIAGNOSIS — J209 Acute bronchitis, unspecified: Secondary | ICD-10-CM

## 2021-07-03 DIAGNOSIS — J069 Acute upper respiratory infection, unspecified: Secondary | ICD-10-CM

## 2021-07-03 DIAGNOSIS — R509 Fever, unspecified: Secondary | ICD-10-CM | POA: Diagnosis not present

## 2021-07-03 DIAGNOSIS — Z20822 Contact with and (suspected) exposure to covid-19: Secondary | ICD-10-CM

## 2021-07-03 MED ORDER — ALBUTEROL SULFATE HFA 108 (90 BASE) MCG/ACT IN AERS: 1.0000 | INHALATION_SPRAY | Freq: Four times a day (QID) | RESPIRATORY_TRACT | 0 refills | Status: AC | PRN

## 2021-07-03 MED ORDER — PREDNISONE 20 MG PO TABS: 40.0000 mg | ORAL_TABLET | Freq: Every day | ORAL | 0 refills | Status: DC

## 2021-07-03 MED ORDER — PROMETHAZINE-DM 6.25-15 MG/5ML PO SYRP: 5.0000 mL | ORAL_SOLUTION | Freq: Four times a day (QID) | ORAL | 0 refills | Status: DC | PRN

## 2021-07-03 NOTE — ED Triage Notes (Signed)
Fever, body aches, cough, congestion since Sunday.

## 2021-07-03 NOTE — ED Provider Notes (Signed)
[facility]    CSN: [id number] Arrival date & time: 07/03/21  0905      History   Chief Complaint No chief complaint on file.   HPI Jim Hamilton is a 50 y.o. male.   Presenting today with 4-day history of fever, body aches, cough, congestion.  States cough has become worse and he is now having chest tightness, soreness with coughing.  Denies significant shortness of breath, abdominal pain, nausea vomiting diarrhea, rashes.  Multiple sick contacts recently.  Has been taking cold and congestion medications over-the-counter with minimal relief.  No known history of pulmonary disease.  Past Medical History:  Diagnosis Date   Hypercholesterolemia    MRSA infection    Scrotal abscess    Patient Active Problem List   Diagnosis Date Noted   Acute bacterial rhinosinusitis 07/27/2020   Hyperlipidemia 07/14/2018   Depression, major, single episode, mild (HCC) 01/22/2018   Elevated LFTs 07/30/2016   Dysphagia 07/30/2016   MRSA infection 01/31/2013   Scrotal abscess 01/31/2013   Past Surgical History:  Procedure Laterality Date   ESOPHAGOGASTRODUODENOSCOPY (EGD) WITH PROPOFOL N/A 11/26/2017   Procedure: ESOPHAGOGASTRODUODENOSCOPY (EGD) WITH PROPOFOL;  Surgeon: Daneil Dolin, MD;  Location: [facility];  Service: Endoscopy;  Laterality: N/A;   TONSILLECTOMY         Home Medications    Prior to Admission medications   Medication Sig Start Date End Date Taking? Authorizing Provider  albuterol (VENTOLIN HFA) 108 (90 Base) MCG/ACT inhaler Inhale 1-2 puffs into the lungs every 6 (six) hours as needed for wheezing or shortness of breath. 07/03/21  Yes Volney American, PA-C  predniSONE (DELTASONE) 20 MG tablet Take 2 tablets (40 mg total) by mouth daily with breakfast. 07/03/21  Yes Volney American, PA-C  promethazine-dextromethorphan (PROMETHAZINE-DM) 6.25-15 MG/5ML syrup Take 5 mLs by mouth 4 (four) times daily as needed for cough. 07/03/21  Yes Volney American, PA-C  doxycycline (VIBRA-TABS) 100 MG tablet Take 1 tablet (100 mg total) by mouth 2 (two) times daily. 08/09/20   [name], MD  pantoprazole (PROTONIX) 40 MG tablet Take 1 tablet (40 mg total) by mouth daily. 08/09/20   [name], MD  rosuvastatin (CRESTOR) 10 MG tablet TAKE 1 TABLET ONCE DAILY. 12/12/20   [name], MD  sertraline (ZOLOFT) 50 MG tablet TAKE 1 TABLET ONCE DAILY. 12/12/20   [name], MD   Family History Family History  Problem Relation Age of Onset   Multiple sclerosis Mother    Heart disease Father    Eczema Daughter    Colon cancer Neg Hx    Gastric cancer Neg Hx    Esophageal cancer Neg Hx    Liver disease Neg Hx    Social History Social History   Tobacco Use   Smoking status: Never   Smokeless tobacco: Never  Substance Use Topics   Alcohol use: Yes    Comment: Once a month (socially)   Drug use: No    Allergies   Patient has no known allergies.   Review of Systems Review of Systems Per HPI  Physical Exam Triage Vital Signs ED Triage Vitals [07/03/21 1051]  Enc Vitals Group     BP 125/73     Pulse Rate 75     Resp 18     Temp 98.2 F (36.8 C)     Temp Source Oral     SpO2 95 %     Weight  Height      Head Circumference      Peak Flow      Pain Score 8     Pain Loc      Pain Edu?      Excl. in [place]?    No data found.  Updated Vital Signs BP 125/73 (BP Location: Right Arm)   Pulse 75   Temp 98.2 F (36.8 C) (Oral)   Resp 18   SpO2 95%   Visual Acuity Right Eye Distance:   Left Eye Distance:   Bilateral Distance:    Right Eye Near:   Left Eye Near:    Bilateral Near:     Physical Exam Vitals and nursing note reviewed.  Constitutional:      Appearance: Normal appearance.  HENT:     Head: Atraumatic.     Right Ear: Tympanic membrane normal.     Left Ear: Tympanic membrane normal.     Nose: Rhinorrhea present.     Mouth/Throat:     Mouth: Mucous membranes are moist.     Pharynx:  Posterior oropharyngeal erythema present.  Eyes:     Extraocular Movements: Extraocular movements intact.     Conjunctiva/sclera: Conjunctivae normal.  Cardiovascular:     Rate and Rhythm: Normal rate and regular rhythm.  Pulmonary:     Effort: Pulmonary effort is normal.     Breath sounds: Wheezing present. No rales.     Comments: Bilateral wheezes at bases Musculoskeletal:        General: Normal range of motion.     Cervical back: Normal range of motion and neck supple.  Skin:    General: Skin is warm and dry.  Neurological:     General: No focal deficit present.     Mental Status: He is oriented to person, place, and time.     Motor: No weakness.     Gait: Gait normal.  Psychiatric:        Mood and Affect: Mood normal.        Thought Content: Thought content normal.        Judgment: Judgment normal.   UC Treatments / Results  Labs (all labs ordered are listed, but only abnormal results are displayed) Labs Reviewed  COVID-19, FLU A+B NAA    EKG   Radiology No results found.  Procedures Procedures (including critical care time)  Medications Ordered in UC Medications - No data to display  Initial Impression / Assessment and Plan / UC Course  I have reviewed the triage vital signs and the nursing notes.  Pertinent labs & imaging results that were available during my care of the patient were reviewed by me and considered in my medical decision making (see chart for details).     Vital signs reassuring today, suspect viral illness now going into bronchitis.  COVID, flu testing pending, will treat with prednisone, Phenergan DM, albuterol inhaler while awaiting test results for symptomatic benefit.  Discussed supportive home care and return precautions.  Work note given.  Final Clinical Impressions(s) / UC Diagnoses   Final diagnoses:  Exposure to COVID-19 virus  Viral URI with cough  Fever, unspecified  Acute bronchitis, unspecified organism   Discharge  Instructions   None    ED Prescriptions     Medication Sig Dispense Auth. Provider   predniSONE (DELTASONE) 20 MG tablet Take 2 tablets (40 mg total) by mouth daily with breakfast. 10 tablet Volney American, PA-C   promethazine-dextromethorphan (PROMETHAZINE-DM) 6.25-15 MG/5ML syrup Take  5 mLs by mouth 4 (four) times daily as needed for cough. 100 mL Volney American, PA-C   albuterol (VENTOLIN HFA) 108 (90 Base) MCG/ACT inhaler Inhale 1-2 puffs into the lungs every 6 (six) hours as needed for wheezing or shortness of breath. 18 g Volney American, [place]      PDMP not reviewed this encounter.   Volney American, [place] 07/03/21 1146

## 2021-07-04 LAB — COVID-19, FLU A+B NAA
Influenza A, NAA: DETECTED — AB
Influenza B, NAA: NOT DETECTED
SARS-CoV-2, NAA: NOT DETECTED

## 2021-08-13 ENCOUNTER — Telehealth: Payer: Self-pay | Admitting: Family Medicine

## 2021-08-13 DIAGNOSIS — E785 Hyperlipidemia, unspecified: Secondary | ICD-10-CM

## 2021-08-13 DIAGNOSIS — Z125 Encounter for screening for malignant neoplasm of prostate: Secondary | ICD-10-CM

## 2021-08-13 DIAGNOSIS — Z79899 Other long term (current) drug therapy: Secondary | ICD-10-CM

## 2021-08-13 NOTE — Telephone Encounter (Signed)
Lipid, liver, metabolic 7, PSA, CBC Wellness hyperlipidemia

## 2021-08-13 NOTE — Telephone Encounter (Signed)
Last labs 10/31/20: Lipid, Liver, Met 7

## 2021-08-13 NOTE — Telephone Encounter (Signed)
Patient has physical for 09/05/21 and needing labs

## 2021-08-14 NOTE — Telephone Encounter (Signed)
Blood work ordered in [employer]. Telephone call- voicemail full

## 2021-08-21 NOTE — Telephone Encounter (Signed)
Left message to return call; sent my chart message also

## 2021-08-22 NOTE — Telephone Encounter (Signed)
Pt contacted and verbalized understanding.  

## 2021-08-25 IMAGING — US US ABDOMEN LIMITED RUQ/ASCITES
1 series · 14 of 25 positions shown · non-contrast
Comparison: Abdominal ultrasound July 03, 2016

CLINICAL DATA: Hepatic steatosis and gallstones

EXAM:
ULTRASOUND ABDOMEN LIMITED RIGHT UPPER QUADRANT

[Series 1: us abdomen limited ruq (liver/gb) · 14 of 64 slices shown]
[im 1/64]
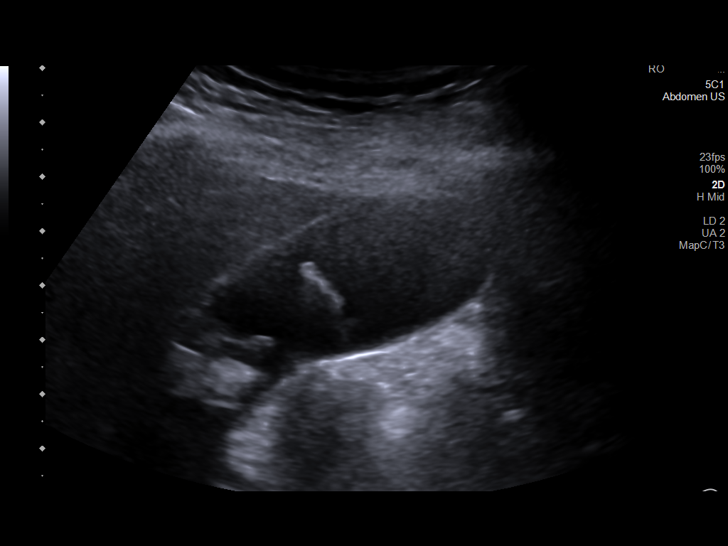
[im 6/64]
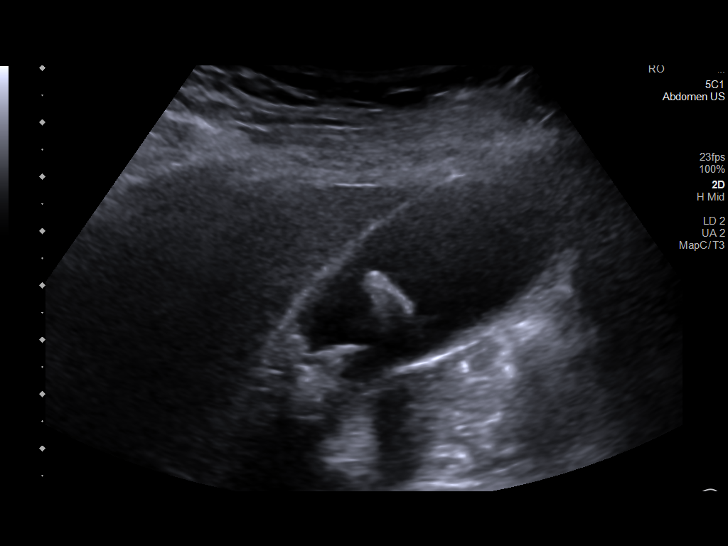
[im 11/64]
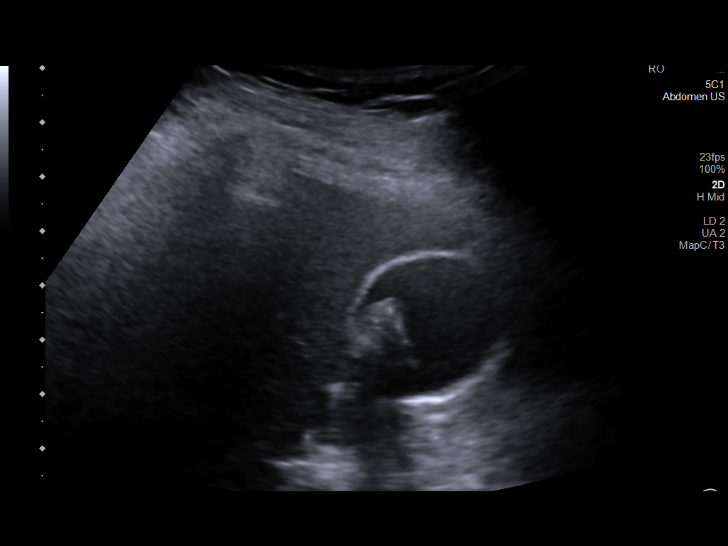
[im 16/64]
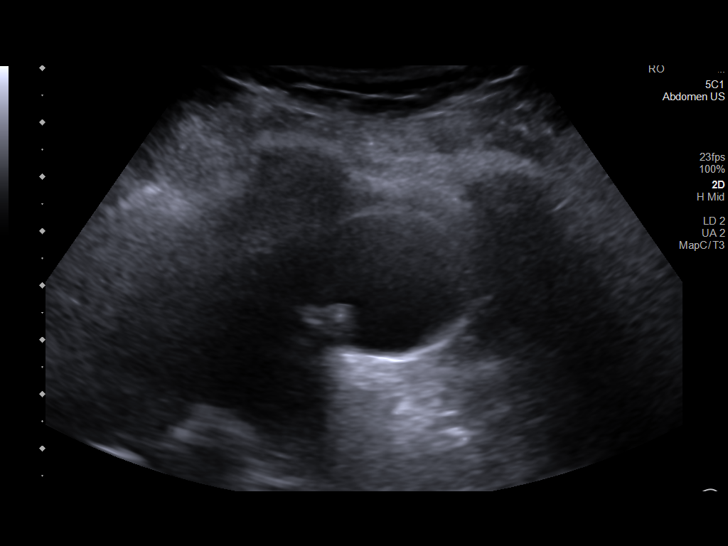
[im 22/64]
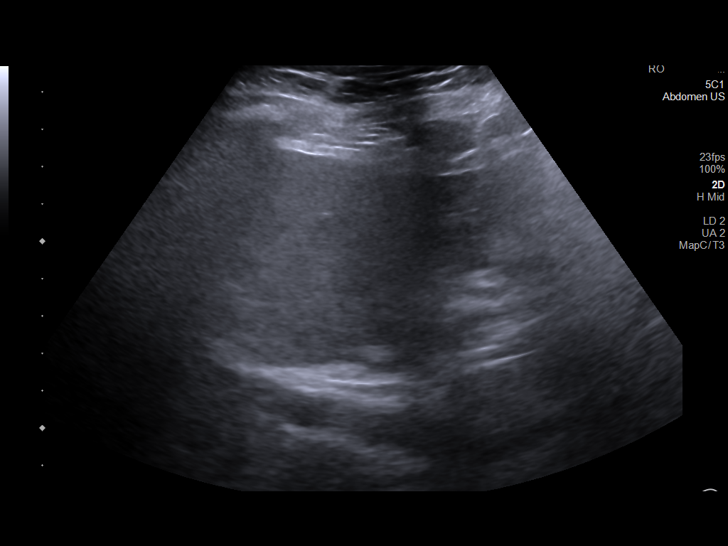
[im 24/64]
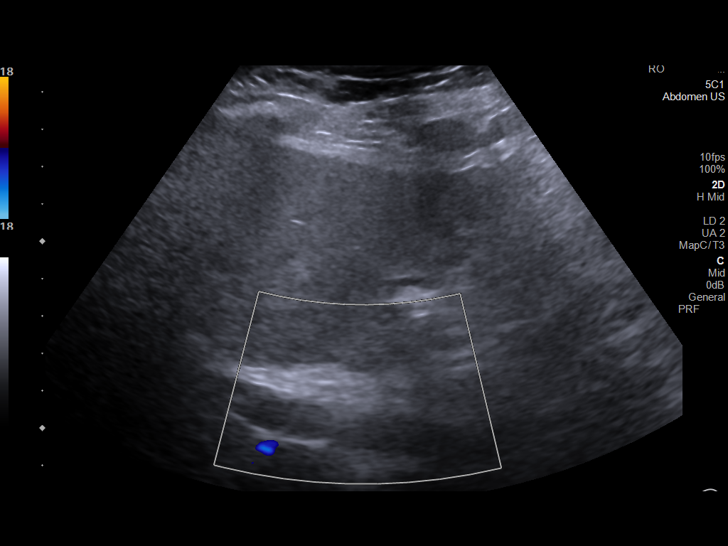
[im 29/64]
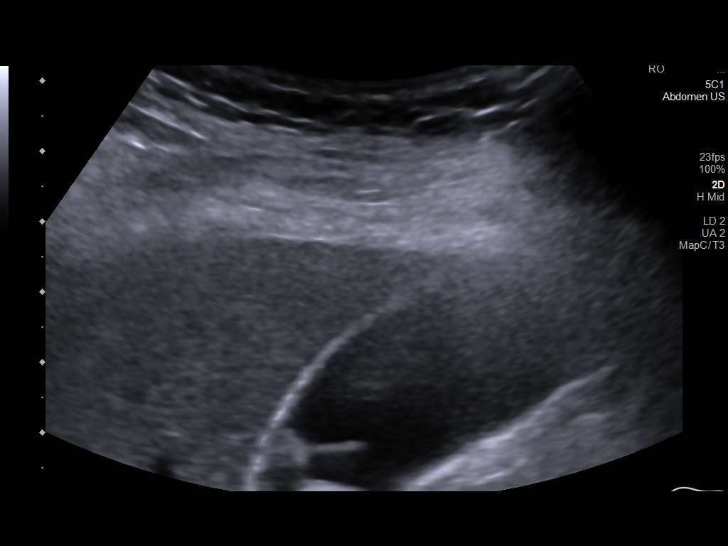
[im 35/64]
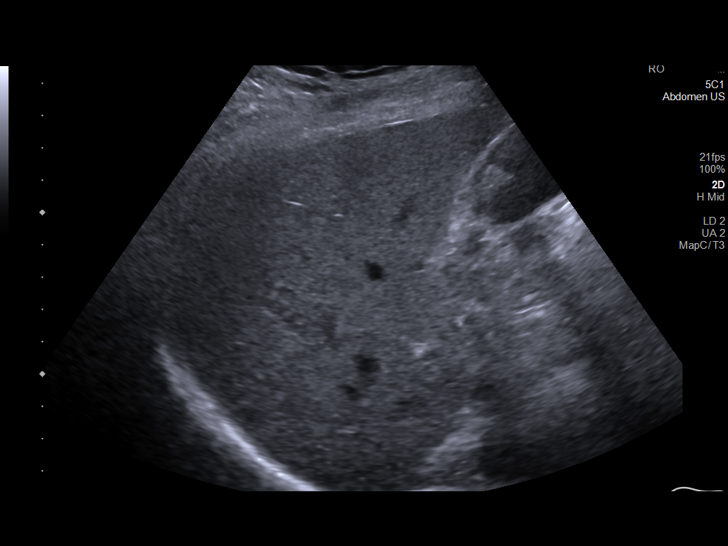
[im 40/64]
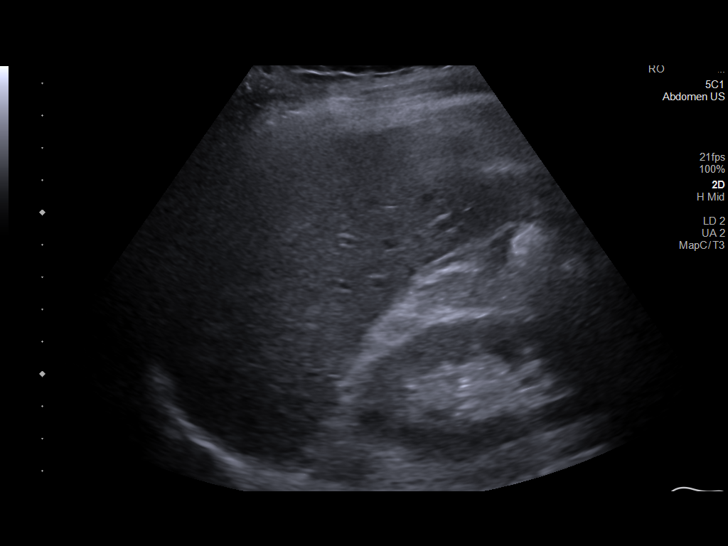
[im 43/64]
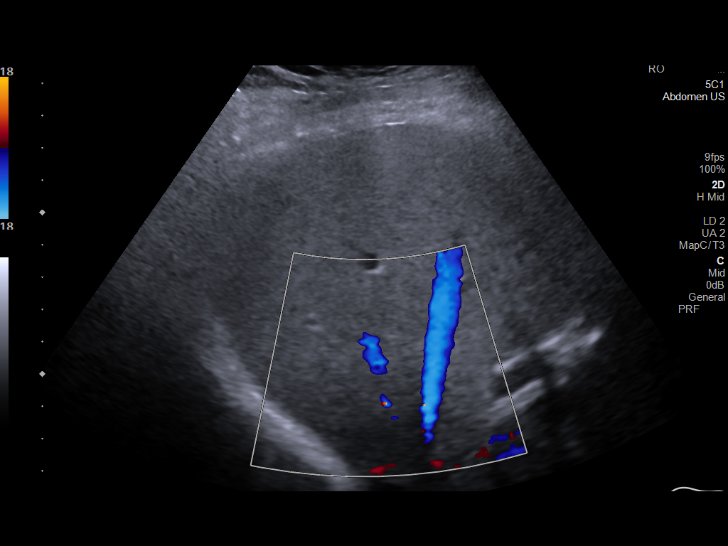
[im 48/64]
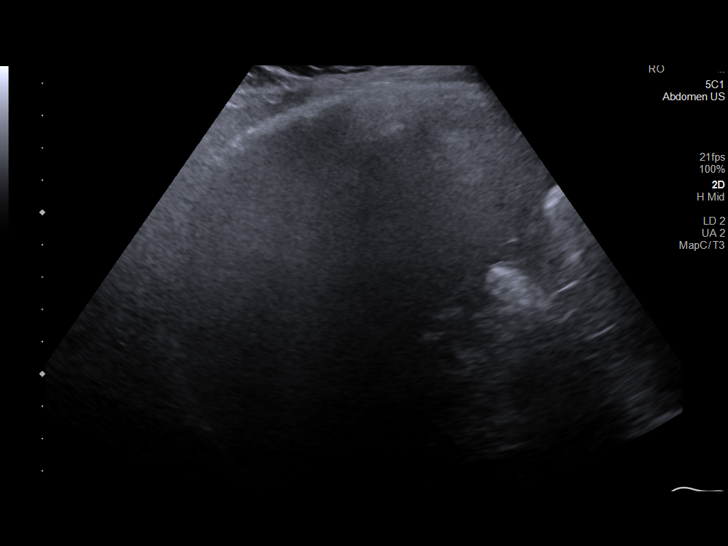
[im 53/64]
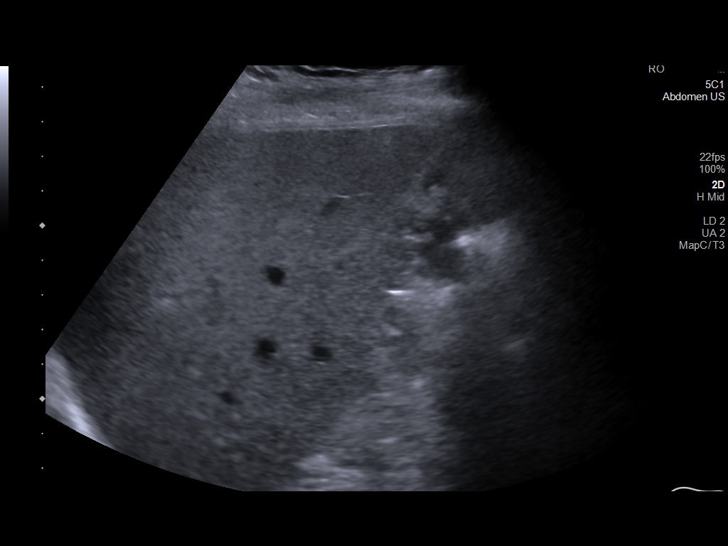
[im 58/64]
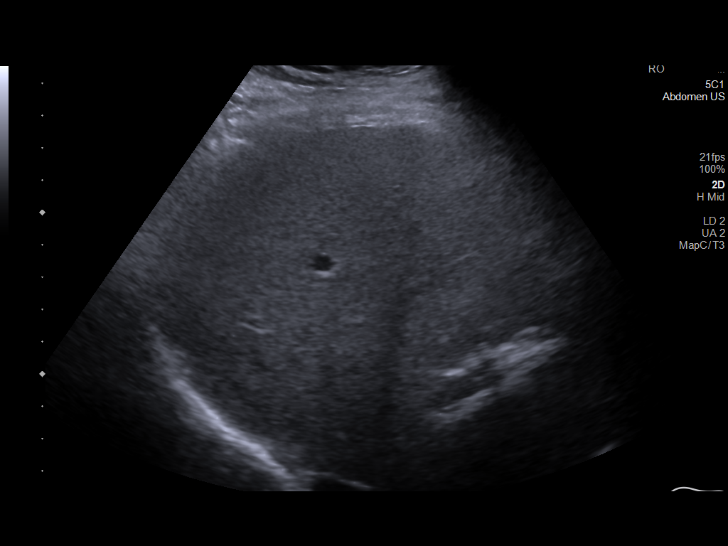
[im 64/64]
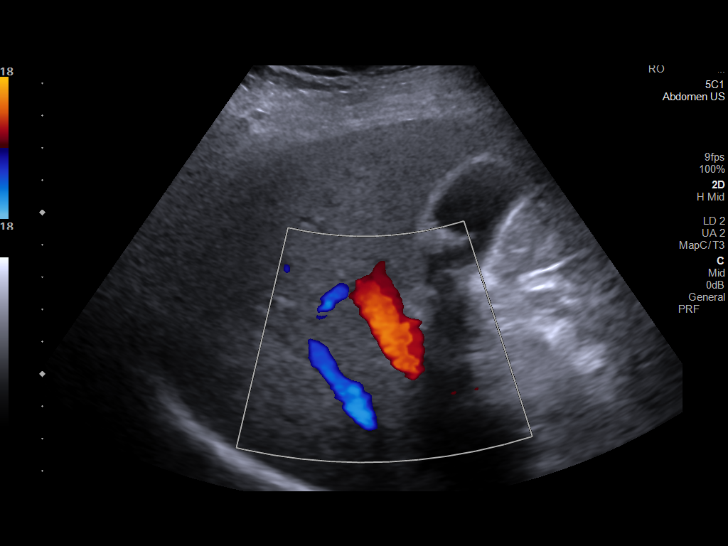

[14 of 25 positions shown; findings below may reference images not displayed]

FINDINGS: Gallbladder:

Cholelithiasis measuring up to 1.8 cm. No pericholecystic fluid or
wall thickening visualized. No sonographic Murphy sign noted by
sonographer.

Common bile duct:

Diameter: 3 mm

Liver:

No focal lesion identified. Diffusely increased parenchymal
echogenicity. Portal vein is patent on color Doppler imaging with
normal direction of blood flow towards the liver.

Other: None.
IMPRESSION: 1. Cholelithiasis without sonographic evidence of acute
cholecystitis.
2. The echogenicity of the liver is increased. This is a nonspecific
finding but is most commonly seen with fatty infiltration of the
liver. There are no obvious focal liver lesions.

## 2021-08-29 LAB — LIPID PANEL
Chol/HDL Ratio: 4.6 ratio (ref 0.0–5.0)
Cholesterol, Total: 185 mg/dL (ref 100–199)
HDL: 40 mg/dL (ref >39)
LDL Chol Calc (NIH): 115 mg/dL — ABNORMAL HIGH (ref 0–99)
Triglycerides: 172 mg/dL — ABNORMAL HIGH (ref 0–149)
VLDL Cholesterol Cal: 30 mg/dL (ref 5–40)

## 2021-08-29 LAB — HEPATIC FUNCTION PANEL
ALT: 36 IU/L (ref 0–44)
AST: 33 IU/L (ref 0–40)
Albumin: 4.7 g/dL (ref 4.0–5.0)
Alkaline Phosphatase: 76 IU/L (ref 44–121)
Bilirubin Total: 0.4 mg/dL (ref 0.0–1.2)
Bilirubin, Direct: 0.11 mg/dL (ref 0.00–0.40)
Total Protein: 7 g/dL (ref 6.0–8.5)

## 2021-08-29 LAB — BASIC METABOLIC PANEL
BUN/Creatinine Ratio: 13 (ref 9–20)
BUN: 15 mg/dL (ref 6–24)
CO2: 25 mmol/L (ref 20–29)
Calcium: 10.3 mg/dL — ABNORMAL HIGH (ref 8.7–10.2)
Chloride: 104 mmol/L (ref 96–106)
Creatinine, Ser: 1.12 mg/dL (ref 0.76–1.27)
Glucose: 89 mg/dL (ref 70–99)
Potassium: 4.3 mmol/L (ref 3.5–5.2)
Sodium: 143 mmol/L (ref 134–144)
eGFR: 80 mL/min/{1.73_m2} (ref >59)

## 2021-08-29 LAB — CBC WITH DIFFERENTIAL/PLATELET
Basophils Absolute: 0 10*3/uL (ref 0.0–0.2)
Basos: 1 %
EOS (ABSOLUTE): 0.2 10*3/uL (ref 0.0–0.4)
Eos: 3 %
Hematocrit: 43.1 % (ref 37.5–51.0)
Hemoglobin: 14.7 g/dL (ref 13.0–17.7)
Immature Grans (Abs): 0 10*3/uL (ref 0.0–0.1)
Immature Granulocytes: 0 %
Lymphocytes Absolute: 2 10*3/uL (ref 0.7–3.1)
Lymphs: 42 %
MCH: 31.8 pg (ref 26.6–33.0)
MCHC: 34.1 g/dL (ref 31.5–35.7)
MCV: 93 fL (ref 79–97)
Monocytes Absolute: 0.4 10*3/uL (ref 0.1–0.9)
Monocytes: 8 %
Neutrophils Absolute: 2.2 10*3/uL (ref 1.4–7.0)
Neutrophils: 46 %
Platelets: 252 10*3/uL (ref 150–450)
RBC: 4.62 x10E6/uL (ref 4.14–5.80)
RDW: 12.1 % (ref 11.6–15.4)
WBC: 4.9 10*3/uL (ref 3.4–10.8)

## 2021-08-29 LAB — PSA: Prostate Specific Ag, Serum: 1.7 ng/mL (ref 0.0–4.0)

## 2021-09-05 ENCOUNTER — Ambulatory Visit (INDEPENDENT_AMBULATORY_CARE_PROVIDER_SITE_OTHER): Payer: PRIVATE HEALTH INSURANCE | Admitting: Family Medicine

## 2021-09-05 ENCOUNTER — Encounter: Payer: Self-pay | Admitting: Family Medicine

## 2021-09-05 ENCOUNTER — Other Ambulatory Visit: Payer: Self-pay

## 2021-09-05 VITALS — BP 108/64 | HR 74 | Temp 97.8°F | Ht 66.5 in | Wt 162.4 lb

## 2021-09-05 DIAGNOSIS — Z1321 Encounter for screening for nutritional disorder: Secondary | ICD-10-CM | POA: Diagnosis not present

## 2021-09-05 DIAGNOSIS — Z79899 Other long term (current) drug therapy: Secondary | ICD-10-CM

## 2021-09-05 DIAGNOSIS — F325 Major depressive disorder, single episode, in full remission: Secondary | ICD-10-CM

## 2021-09-05 DIAGNOSIS — Z1211 Encounter for screening for malignant neoplasm of colon: Secondary | ICD-10-CM

## 2021-09-05 DIAGNOSIS — E785 Hyperlipidemia, unspecified: Secondary | ICD-10-CM | POA: Diagnosis not present

## 2021-09-05 DIAGNOSIS — E875 Hyperkalemia: Secondary | ICD-10-CM | POA: Diagnosis not present

## 2021-09-05 MED ORDER — ROSUVASTATIN CALCIUM 20 MG PO TABS: ORAL_TABLET | ORAL | 3 refills | Status: AC

## 2021-09-05 NOTE — Progress Notes (Signed)
Subjective:    Patient ID: Jim Hamilton, male    DOB: 04/17/1971, 51 y.o.   MRN: [id number]  HPI The patient comes in today for a wellness visit.  Patient does have family history of heart disease He is trying hard at working on things States moods are doing good He states he will be tapering off of sertraline come springtime   A review of their health history was completed.  A review of medications was also completed.  Any needed refills; Crestor and Zoloft  Eating habits: Fair   Falls/  MVA accidents in past few months: none  Regular exercise: none  Specialist pt sees on regular basis: none  Preventative health issues were discussed.   Additional concerns: coming off Zoloft     Review of Systems     Objective:   Physical Exam  General-in no acute distress Eyes-no discharge Lungs-respiratory rate normal, CTA CV-no murmurs,RRR Extremities skin warm dry no edema Neuro grossly normal Behavior normal, alert Rectal exam normal Labs reviewed in detail  Results for orders placed or performed in visit on 08/13/21  Lipid panel  Result Value Ref Range   Cholesterol, Total 185 100 - 199 mg/dL   Triglycerides 172 (H) 0 - 149 mg/dL   HDL 40 >39 mg/dL   VLDL Cholesterol Cal 30 5 - 40 mg/dL   LDL Chol Calc (NIH) 115 (H) 0 - 99 mg/dL   Chol/HDL Ratio 4.6 0.0 - 5.0 ratio  Hepatic function panel  Result Value Ref Range   Total Protein 7.0 6.0 - 8.5 g/dL   Albumin 4.7 4.0 - 5.0 g/dL   Bilirubin Total 0.4 0.0 - 1.2 mg/dL   Bilirubin, Direct 0.11 0.00 - 0.40 mg/dL   Alkaline Phosphatase 76 44 - 121 IU/L   AST 33 0 - 40 IU/L   ALT 36 0 - 44 IU/L  Basic metabolic panel  Result Value Ref Range   Glucose 89 70 - 99 mg/dL   BUN 15 6 - 24 mg/dL   Creatinine, Ser 1.12 0.76 - 1.27 mg/dL   eGFR 80 >59 mL/min/1.73   BUN/Creatinine Ratio 13 9 - 20   Sodium 143 134 - 144 mmol/L   Potassium 4.3 3.5 - 5.2 mmol/L   Chloride 104 96 - 106 mmol/L   CO2 25 20 - 29 mmol/L    Calcium 10.3 (H) 8.7 - 10.2 mg/dL  PSA  Result Value Ref Range   Prostate Specific Ag, Serum 1.7 0.0 - 4.0 ng/mL  CBC with Differential/Platelet  Result Value Ref Range   WBC 4.9 3.4 - 10.8 x10E3/uL   RBC 4.62 4.14 - 5.80 x10E6/uL   Hemoglobin 14.7 13.0 - 17.7 g/dL   Hematocrit 43.1 37.5 - 51.0 %   MCV 93 79 - 97 fL   MCH 31.8 26.6 - 33.0 pg   MCHC 34.1 31.5 - 35.7 g/dL   RDW 12.1 11.6 - 15.4 %   Platelets 252 150 - 450 x10E3/uL   Neutrophils 46 Not Estab. %   Lymphs 42 Not Estab. %   Monocytes 8 Not Estab. %   Eos 3 Not Estab. %   Basos 1 Not Estab. %   Neutrophils Absolute 2.2 1.4 - 7.0 x10E3/uL   Lymphocytes Absolute 2.0 0.7 - 3.1 x10E3/uL   Monocytes Absolute 0.4 0.1 - 0.9 x10E3/uL   EOS (ABSOLUTE) 0.2 0.0 - 0.4 x10E3/uL   Basophils Absolute 0.0 0.0 - 0.2 x10E3/uL   Immature Granulocytes 0 Not Estab. %  Immature Grans (Abs) 0.0 0.0 - 0.1 x10E3/uL         Assessment & Plan:  1. Hyperlipidemia, unspecified hyperlipidemia type Healthy diet regular activity bump up the dose of rosuvastatin to 20 mg daily recheck labs again by spring/6 to 8 weeks - Lipid Profile - Calcium, ionized - PTH, Intact and Calcium - Vitamin D (25 hydroxy)  2. High risk medication use Check labs - Lipid Profile - Calcium, ionized - PTH, Intact and Calcium - Vitamin D (25 hydroxy) y)  4. Encounter for vitamin deficiency screening Hypercalcemia check vitamin D - Vitamin D (25 hydroxy)  5. Encounter for screening colonoscopy Referral to GI - Ambulatory referral to Gastroenterology  6. Major depressive disorder with single episode, in full remission ([place]) I agree with patient is it would be okay for him to taper off of the sertraline by March if he starts noticing his symptoms coming back over his the course of the next several weeks or anywhere in the future to follow-up and call may need to reinitiate medicine

## 2021-09-11 ENCOUNTER — Encounter: Payer: Self-pay | Admitting: Internal Medicine

## 2021-09-17 ENCOUNTER — Other Ambulatory Visit: Payer: Self-pay | Admitting: Family Medicine

## 2021-09-18 NOTE — Telephone Encounter (Signed)
90-day with 1 refill each

## 2021-09-21 ENCOUNTER — Other Ambulatory Visit: Payer: Self-pay | Admitting: Family Medicine

## 2021-11-06 ENCOUNTER — Ambulatory Visit: Payer: No Typology Code available for payment source

## 2021-11-06 ENCOUNTER — Encounter: Payer: Self-pay | Admitting: Internal Medicine

## 2021-11-06 ENCOUNTER — Telehealth: Payer: Self-pay | Admitting: *Deleted

## 2021-11-06 ENCOUNTER — Other Ambulatory Visit: Payer: Self-pay

## 2021-11-06 NOTE — Telephone Encounter (Signed)
Tried to call pt for 2:00 nurse visit x 2 times. Unable to leave a voice mail. ?

## 2022-01-20 ENCOUNTER — Telehealth: Payer: Self-pay | Admitting: Family Medicine

## 2022-01-20 NOTE — Telephone Encounter (Signed)
Nurses So I received a request regarding some psychologic information It does have a written release by the patient But at the same time this is highly sensitive material  Please directly contact with the patient Discuss with him his willingness for [place] to provide this information  Please document that accordingly  Please then if he does consent-discuss the questions on page 2 And right the answers on the attached areas and I will transfer them to the questionnaire and sign them  If the patient states that he does not want [place] to release this please document accordingly Also if he requests an office visit for this issue please set up an office visit Please also make sure that he is doing well on his medicine currently and document  Thanks-Dr. Nicki Reaper

## 2022-01-22 NOTE — Telephone Encounter (Signed)
Left message for patient to return the call for additional details and recommendations.  Regarding form completing

## 2022-02-04 NOTE — Telephone Encounter (Signed)
Left message for patient to return the call for additional details to complete form .

## 2022-02-12 ENCOUNTER — Encounter: Payer: Self-pay | Admitting: Family Medicine

## 2022-02-12 NOTE — Telephone Encounter (Signed)
I would recommend keeping the forearm available Please mail the patient a letter stating that you had some questions regarding this form before it can be filled out Thank you

## 2022-02-12 NOTE — Telephone Encounter (Signed)
We have been unable to reach patient(multiple attempts made without success)

## 2022-02-12 NOTE — Telephone Encounter (Signed)
Letter sent to patient.
